# Patient Record
Sex: Female | Born: 1970 | Race: White | Hispanic: No | Marital: Married | Smoking: Never smoker
Health system: Southern US, Community
[De-identification: ages and names within clinical notes are randomized; demographics above are authoritative.]

## PROBLEM LIST (undated history)

## (undated) DIAGNOSIS — R12 Heartburn: Secondary | ICD-10-CM

## (undated) DIAGNOSIS — Z309 Encounter for contraceptive management, unspecified: Secondary | ICD-10-CM

## (undated) DIAGNOSIS — R58 Hemorrhage, not elsewhere classified: Secondary | ICD-10-CM

## (undated) HISTORY — DX: Heartburn: R12

## (undated) HISTORY — PX: CHOLECYSTECTOMY: SHX55

## (undated) HISTORY — DX: Hemorrhage, not elsewhere classified: R58

## (undated) HISTORY — DX: Encounter for contraceptive management, unspecified: Z30.9

---

## 2001-10-31 ENCOUNTER — Inpatient Hospital Stay (HOSPITAL_COMMUNITY): Admission: RE | Admit: 2001-10-31 | Discharge: 2001-11-03 | Payer: Self-pay | Admitting: Obstetrics and Gynecology

## 2004-02-06 ENCOUNTER — Ambulatory Visit (HOSPITAL_COMMUNITY): Admission: AD | Admit: 2004-02-06 | Discharge: 2004-02-06 | Payer: Self-pay | Admitting: Internal Medicine

## 2004-02-16 ENCOUNTER — Ambulatory Visit (HOSPITAL_COMMUNITY): Admission: RE | Admit: 2004-02-16 | Discharge: 2004-02-16 | Payer: Self-pay | Admitting: Obstetrics & Gynecology

## 2004-04-23 ENCOUNTER — Ambulatory Visit (HOSPITAL_COMMUNITY): Admission: AD | Admit: 2004-04-23 | Discharge: 2004-04-23 | Payer: Self-pay | Admitting: Obstetrics & Gynecology

## 2004-04-25 ENCOUNTER — Inpatient Hospital Stay (HOSPITAL_COMMUNITY): Admission: AD | Admit: 2004-04-25 | Discharge: 2004-04-28 | Payer: Self-pay | Admitting: Obstetrics and Gynecology

## 2004-05-28 ENCOUNTER — Ambulatory Visit (HOSPITAL_COMMUNITY): Admission: RE | Admit: 2004-05-28 | Discharge: 2004-05-28 | Payer: Self-pay | Admitting: General Surgery

## 2008-01-10 ENCOUNTER — Other Ambulatory Visit: Admission: RE | Admit: 2008-01-10 | Discharge: 2008-01-10 | Payer: Self-pay | Admitting: Obstetrics and Gynecology

## 2009-03-12 ENCOUNTER — Other Ambulatory Visit: Admission: RE | Admit: 2009-03-12 | Discharge: 2009-03-12 | Payer: Self-pay | Admitting: Obstetrics and Gynecology

## 2010-05-09 ENCOUNTER — Other Ambulatory Visit
Admission: RE | Admit: 2010-05-09 | Discharge: 2010-05-09 | Payer: Self-pay | Source: Home / Self Care | Admitting: Obstetrics and Gynecology

## 2011-02-07 NOTE — Op Note (Signed)
NAME:  Betty Coleman, Betty Coleman                         ACCOUNT NO.:  [id number]   MEDICAL RECORD NO.:  [id number]                   PATIENT TYPE:  AMB   LOCATION:  DAY                                  FACILITY:  [facility]   PHYSICIAN:  Dalia Heading, M.D.               DATE OF BIRTH:  03/19/1971   DATE OF PROCEDURE:  05/28/2004  DATE OF DISCHARGE:                                 OPERATIVE REPORT   PREOPERATIVE DIAGNOSIS:  Cholecystitis, cholelithiasis.   POSTOPERATIVE DIAGNOSIS:  Cholecystitis, cholelithiasis.   OPERATION PERFORMED:  Laparoscopic cholecystectomy.   SURGEON:  Dalia Heading, M.D.   ANESTHESIA:  General endotracheal.   INDICATIONS FOR PROCEDURE:  The patient is a 40 year old white female who  presents with cholecystitis secondary to cholelithiasis.  This was diagnosed  while she was pregnant and she is now postpartum.  The risks and benefits of  the procedure including bleeding, infection, hepatobiliary injury, and the  possibility of an open procedure were fully explained to the patient, who  gave informed consent.   DESCRIPTION OF PROCEDURE:  The patient was placed in supine position.  After  induction of general endotracheal anesthesia, the abdomen was prepped and  draped using the usual sterile technique with Betadine.  Surgical site  confirmation was performed.  An infraumbilical incision was made down to the  fascia.  A Veress needle was introduced to the abdominal cavity and  confirmation of placement was done using the saline drop test.  The abdomen  was then insufflated to 16 mmHg pressure.  An 11 mm trocar was introduced  into the abdominal cavity under direct visualization without difficulty.  The patient was placed in reverse Trendelenburg position and an additional  11 mm trocar was placed in the epigastric region and a 5 mm trocar was  placed in the right upper quadrant, right flank regions.  The liver was  inspected and noted to be within normal limits.   There were multiple  adhesions of bowel around the gallbladder.  These were freed away without  difficulty.  The gallbladder was retracted superior and laterally.  The  dissection was begun around the infundibulum of the gallbladder.  The cystic  duct was first identified.  It was noted to be sclerotic.  The juncture with  the infundibulum was fully identified.  Endoclips were placed proximally and  distally on the cystic duct and the cystic duct was divided. This was  likewise done on the cystic artery.  The gallbladder was then freed away  from the gallbladder fossa using Bovie electrocautery.  The gallbladder was  delivered through the epigastric trocar site using EndoCatch bag.  The  gallbladder fossa was inspected and no abnormal bleeding or bile leakage was  noted.  Surgicel was placed in the gallbladder fossa.  All fluid and air  were then evacuated from the abdominal cavity prior to removal of the  trocars.  All wounds were irrigated with normal saline.  All wounds were injected with  0.5% Sensorcaine.  The infraumbilical fascia was reapproximated using a 0  Vicryl interrupted suture.  All skin incisions were closed using staples.  Betadine ointment and dry sterile dressings were applied.  All tape and  needle counts correct at the end of the procedure.  The patient was  extubated in the operating room and went back to recovery room awake and in  stable condition.   COMPLICATIONS:  None.   SPECIMENS:  Gallbladder.   ESTIMATED BLOOD LOSS:  Minimal.      ___________________________________________                                            Dalia Heading, M.D.   MAJ/MEDQ  D:  05/28/2004  T:  05/28/2004  Job:  [id number]   cc:   Lazaro Arms, M.D.  [address]  [place]  [place] [address]  Fax: [phone]

## 2011-02-07 NOTE — H&P (Signed)
NAME:  CASIA, CORTI                         ACCOUNT NO.:  [id number]   MEDICAL RECORD NO.:  [id number]                   PATIENT TYPE:  PINP   LOCATION:                                       FACILITY:  [facility]   PHYSICIAN:  Tilda Burrow, M.D.              DATE OF BIRTH:  10-Jan-1971   DATE OF ADMISSION:  DATE OF DISCHARGE:                                HISTORY & PHYSICAL   ADMITTING DIAGNOSIS:  Pregnancy 40-1/2 to 41-1/2 weeks' gestation, reactive  NST, impending post dates.   HISTORY OF PRESENT ILLNESS:  This 40 year old female, gravida 2, para 1, AB  0, last menstrual period (?) July 10, 2003, plus [facility] July 25, with 18-  week ultrasound suggesting July 31, and ultrasound in first trimester  suggesting August 4.  She was admitted on the evening of August 4 for  cervical ripening and Pitocin induction on the 5th.  She has been followed  through our office through 16 prenatal visits.  Appropriate weight gain and  fundal height.  The pregnancy has been  complicated by gallstones which are  currently silent.  She has been seen by Dr. Lovell Sheehan in the past who will  likely follow up with her postpartum.   PAST MEDICAL HISTORY:  Benign surgical history, negative.   ALLERGIES:  None.   SOCIAL HISTORY:  Married, florist.   PRIOR OB HISTORY:  Notable for delivery in 2003 induced with Cervidil with  labor less than 12 hours resulting in a health delivery of a 6 pound, 7  ounce female, by Zerita Boers.   PHYSICAL EXAMINATION:  VITAL SIGNS:  Height 5 feet, 4 inches.  Weight 150.  Blood pressure 110/80.  GENERAL:  Shows healthy female, appropriate for gestational age.  Fundal  height 38 cm.  Cervical still 1 to 2 cm thick, soft, posterior and high with  ballotable vertex as of April 23, 2004.   PRENATAL LABORATORY DATA:  Blood type A positive, UDS negative, rubella  immunity present.  Hemoglobin 13, hematocrit 42.  Hepatitis/HIV, GC,  Chlamydia, RPR are all negative.  Glucose  tolerance test abnormal at 163 mg  percent at one hour with normal three hour GTT.   She request epidural, and plans to have the baby circumcised if female.  Plans  for pill contraception.  Will take the baby to [facility].   IMPRESSION:  1. Pregnancy 40-1/2 to 41-1/2 weeks, impending post dates.  2. Induction of labor.   PLAN:  Cervical ripening April 25, 2004.  Pitocin induction April 26, 2004.     ___________________________________________                                         Tilda Burrow, M.D.   JVF/MEDQ  D:  04/24/2004  T:  04/25/2004  Job:  [id number]   cc:   Francoise Schaumann. Halm, D.O.  [address], [address]  [place]  [place] [address]  Fax: [phone]

## 2011-02-07 NOTE — Op Note (Signed)
[facility]  Patient:    LAFERN, BRINKLEY Visit Number: [id number] MRN: [id number]          Service Type: OBS Location: 4A A418 01 Attending Physician:  Tilda Burrow Dictated by:   Zerita Boers, CNM Proc. Date: 11/01/01 Admit Date:  10/31/2001   CC:         [facility]                           Operative Report  ONSET OF LABOR:  November 01, 2001, at 6 a.m.  DATE OF DELIVERY:  November 01, 2001, at 8:55 a.m.  LENGTH OF FIRST STAGE OF LABOR:  2 hours 25 minutes.  LENGTH OF SECOND STAGE OF LABOR:  30 minutes.  LENGTH OF THIRD STAGE OF LABOR:  15 minutes.  DELIVERY NOTE:  Purva had a vacuum-assisted delivery from a +2-+3 station due to fetal bradycardia.  During the course of pushing, fetal heart was assessed and accelerations were noted with scalp stem.  The Kiwi was applied and the patient was assisted through approximately four contractions and midline episiotomy was cut under local anesthesia of 1% lidocaine with spontaneous delivery of head and spontaneous delivery of infant without any nuchal cord noted.  Infant had spontaneous cry, good tone, good color upon delivery. Cord was clamped.  Father cut the cord and infant was laid upon the abdomen of the mother, crying vigorously, to be dried by the nurse.  Infant Apgars were 9 and 9.  The infant weight was 6 pounds, 7 ounces.  Mother and infant both tolerated the procedure well.  Episiotomy was repaired with a blanket stitch of approximately three sutures subcuticular to good close with good hemostasis obtained.  Placenta was delivered spontaneously via _____ mechanism.  Third stage of labor was actually managed with 20 units Pitocin and 1000 cc of D-5 lactated Ringers at a rapid rate.   Estimated blood loss was approximately 350 cc.  After episiotomy repair, the infant was placed to breast, nursing well.  Infant and mother both stabilized and transferred out to the postpartum unit  in stable condition. Dictated by:   Zerita Boers, CNM Attending Physician:  Tilda Burrow DD:  11/01/01 TD:  11/01/01 Job: [id number]

## 2011-02-07 NOTE — H&P (Signed)
NAME:  Betty Coleman, Betty Coleman                         ACCOUNT NO.:  [id number]   MEDICAL RECORD NO.:  [id number]                   PATIENT TYPE:  AMB   LOCATION:  DAY                                  FACILITY:  [facility]   PHYSICIAN:  Dalia Heading, M.D.               DATE OF BIRTH:  04-Nov-1970   DATE OF ADMISSION:  DATE OF DISCHARGE:                                HISTORY & PHYSICAL   CHIEF COMPLAINT:  Cholecystitis, cholelithiasis.   HISTORY OF PRESENT ILLNESS:  The patient is a 40 year old white female who  is referred for evaluation and treatment of biliary colic secondary to  cholelithiasis.  Her cholelithiasis was diagnosed when she was [redacted] weeks  pregnant.  She did subsequently have a vaginal delivery, 1 month ago.  She  now presents for a cholecystectomy due to ongoing biliary colic.   PAST MEDICAL HISTORY:  As noted above.   PAST SURGICAL HISTORY:  Unremarkable.   CURRENT MEDICATIONS:  Prevacid, perinatal vitamins.   ALLERGIES:  No known drug allergies.   REVIEW OF SYSTEMS:  Noncontributory.   PHYSICAL EXAMINATION:  GENERAL:  On physical examination, the patient is a  well-developed well-nourished white female in no acute distress.  VITAL SIGNS:  She is afebrile and vital signs are stable.  LUNGS:  Clear to auscultation with equal breath sounds bilaterally.  HEART:  Heart examination reveals a regular rate and rhythm without S3, S4,  or murmurs.  ABDOMEN:  The abdomen is soft, nontender, nondistended.  No  hepatosplenomegaly or masses are noted.   IMPRESSION:  Biliary colic, cholelithiasis.   PLAN:  The patient is scheduled for laparoscopic cholecystectomy on  05/28/2004.  The risks and benefits of the procedure including bleeding,  infection, hepatobiliary injury, and the possibility of an open procedure  were fully explained to the patient, who gave informed consent.  She was  instructed not to breast feed for 48 hours after the surgery.     ___________________________________________                                         Dalia Heading, M.D.   MAJ/MEDQ  D:  05/23/2004  T:  05/23/2004  Job:  [id number]   cc:   Lazaro Arms, M.D.  [address]  [place]  [place] [address]  Fax: [phone]   Dalia Heading, M.D.  [address], [place]  [place] [address]  Fax: [phone]

## 2011-02-07 NOTE — Op Note (Signed)
NAME:  MOET, MIKULSKI                         ACCOUNT NO.:  [id number]   MEDICAL RECORD NO.:  [id number]                   PATIENT TYPE:  INP   LOCATION:  NA                                   FACILITY:  [facility]   PHYSICIAN:  Tilda Burrow, M.D.              DATE OF BIRTH:  [birth year]   DATE OF PROCEDURE:  DATE OF DISCHARGE:                                 OPERATIVE REPORT   Rakesha progressed steadily through labor, denied the need for IV pain  medicine and developed a strong urge to push at approximately 11:40.  There  was a small anterior lip which was reduced over two pushes and she delivered  a viable female infant at 11:49.  The mouth and nose were suctioned in the  perineum, the body delivered without difficulty.  Weight 7 pounds, 7 ounces,  Apgars 9 and 9.  Pitocin 20 units diluted in 1000 mL of lactated Ringers was  then infused rapidly IV.  The placenta separated spontaneously and was  delivered via controlled cord traction at 11:55.  It was inspected and  appears to be intact with a 3-vessel cord.  The fundus was firm with minimal  blood loss noted.  The vagina was then inspected and no lacerations were  found.  Estimated blood loss 300 mL.     ________________________________________  ___________________________________________  Jacklyn Shell, C.N.M.           Tilda Burrow, M.D.   FC/MEDQ  D:  04/26/2004  T:  04/28/2004  Job:  [id number]   cc:   [facility]  [address]  [place], [place] [address]

## 2011-07-29 ENCOUNTER — Other Ambulatory Visit (HOSPITAL_COMMUNITY)
Admission: RE | Admit: 2011-07-29 | Discharge: 2011-07-29 | Disposition: A | Discharge status: Home / Self Care | Payer: PRIVATE HEALTH INSURANCE | Source: Ambulatory Visit | Attending: Obstetrics and Gynecology | Admitting: Obstetrics and Gynecology

## 2011-07-29 DIAGNOSIS — Z01419 Encounter for gynecological examination (general) (routine) without abnormal findings: Secondary | ICD-10-CM | POA: Insufficient documentation

## 2012-09-10 ENCOUNTER — Other Ambulatory Visit (HOSPITAL_COMMUNITY)
Admission: RE | Admit: 2012-09-10 | Discharge: 2012-09-10 | Disposition: A | Discharge status: Home / Self Care | Payer: PRIVATE HEALTH INSURANCE | Source: Ambulatory Visit | Attending: Obstetrics and Gynecology | Admitting: Obstetrics and Gynecology

## 2012-09-10 DIAGNOSIS — Z1151 Encounter for screening for human papillomavirus (HPV): Secondary | ICD-10-CM | POA: Insufficient documentation

## 2012-09-10 DIAGNOSIS — Z01419 Encounter for gynecological examination (general) (routine) without abnormal findings: Secondary | ICD-10-CM | POA: Insufficient documentation

## 2012-12-20 ENCOUNTER — Other Ambulatory Visit: Payer: Self-pay | Admitting: Obstetrics and Gynecology

## 2012-12-20 ENCOUNTER — Other Ambulatory Visit: Payer: Self-pay | Admitting: Adult Health

## 2012-12-20 DIAGNOSIS — Z139 Encounter for screening, unspecified: Secondary | ICD-10-CM

## 2012-12-23 ENCOUNTER — Ambulatory Visit (HOSPITAL_COMMUNITY): Payer: PRIVATE HEALTH INSURANCE

## 2012-12-27 ENCOUNTER — Ambulatory Visit (HOSPITAL_COMMUNITY)
Admission: RE | Admit: 2012-12-27 | Discharge: 2012-12-27 | Disposition: A | Discharge status: Home / Self Care | Payer: PRIVATE HEALTH INSURANCE | Source: Ambulatory Visit | Attending: Adult Health | Admitting: Adult Health

## 2012-12-27 DIAGNOSIS — Z1231 Encounter for screening mammogram for malignant neoplasm of breast: Secondary | ICD-10-CM | POA: Insufficient documentation

## 2012-12-27 DIAGNOSIS — Z139 Encounter for screening, unspecified: Secondary | ICD-10-CM

## 2012-12-28 ENCOUNTER — Telehealth: Payer: Self-pay | Admitting: Adult Health

## 2012-12-28 NOTE — Telephone Encounter (Signed)
Left message to call me.

## 2012-12-29 ENCOUNTER — Other Ambulatory Visit: Payer: Self-pay | Admitting: Adult Health

## 2012-12-29 ENCOUNTER — Telehealth: Payer: Self-pay | Admitting: Adult Health

## 2012-12-29 DIAGNOSIS — R928 Other abnormal and inconclusive findings on diagnostic imaging of breast: Secondary | ICD-10-CM

## 2012-12-29 NOTE — Telephone Encounter (Signed)
Pt. Called me back and I informed her of need for further imaging of breast and gave her the number to call xray to call as she will be out of town for several days.

## 2013-01-05 ENCOUNTER — Encounter (HOSPITAL_COMMUNITY): Payer: PRIVATE HEALTH INSURANCE

## 2013-01-05 ENCOUNTER — Ambulatory Visit (HOSPITAL_COMMUNITY)
Admission: RE | Admit: 2013-01-05 | Discharge: 2013-01-05 | Disposition: A | Discharge status: Home / Self Care | Payer: PRIVATE HEALTH INSURANCE | Source: Ambulatory Visit | Attending: Adult Health | Admitting: Adult Health

## 2013-01-05 DIAGNOSIS — R928 Other abnormal and inconclusive findings on diagnostic imaging of breast: Secondary | ICD-10-CM | POA: Insufficient documentation

## 2013-01-06 ENCOUNTER — Telehealth: Payer: Self-pay | Admitting: Adult Health

## 2013-01-06 NOTE — Telephone Encounter (Signed)
Left message that follow up mammogram was normal

## 2013-08-21 ENCOUNTER — Other Ambulatory Visit: Payer: Self-pay | Admitting: Adult Health

## 2013-08-31 ENCOUNTER — Ambulatory Visit (INDEPENDENT_AMBULATORY_CARE_PROVIDER_SITE_OTHER): Payer: PRIVATE HEALTH INSURANCE | Admitting: Family Medicine

## 2013-08-31 ENCOUNTER — Encounter: Payer: Self-pay | Admitting: Family Medicine

## 2013-08-31 VITALS — BP 122/68 | Temp 100.4°F | Ht 63.0 in | Wt 135.2 lb

## 2013-08-31 DIAGNOSIS — J111 Influenza due to unidentified influenza virus with other respiratory manifestations: Secondary | ICD-10-CM

## 2013-08-31 MED ORDER — OSELTAMIVIR PHOSPHATE 75 MG PO CAPS: 75.0000 mg | ORAL_CAPSULE | Freq: Two times a day (BID) | ORAL | Status: DC

## 2013-08-31 NOTE — Progress Notes (Signed)
   Subjective:    Patient ID: Betty Coleman, female    DOB: [birth year], 42 y.o.   MRN: [id number]  Fever  This is a new problem. The current episode started in the past 7 days. The problem occurs constantly. The problem has been unchanged. The maximum temperature noted was 100 to 100.9 F. The temperature was taken using an oral thermometer. Associated symptoms include congestion, coughing, headaches and muscle aches. She has tried NSAIDs for the symptoms. The treatment provided mild relief.   Hit hard on Monday,  26 y o has had cough for several wks,  Sund, tickle some exp to public  m102 t max, don't smoke headche off and on Took one ibu     Review of Systems  Constitutional: Positive for fever.  HENT: Positive for congestion.   Respiratory: Positive for cough.   Neurological: Positive for headaches.       Objective:   Physical Exam Alert moderate malaise. Temp 100.4. HEENT moderate nasal congestion pharynx normal neck supple. Lungs clear. Heart regular in rhythm.       Assessment & Plan:  Impression 1 flu discussed plan Tamiflu twice a day 5 days. Symptomatic care discussed. WSL

## 2013-10-10 ENCOUNTER — Other Ambulatory Visit: Payer: PRIVATE HEALTH INSURANCE | Admitting: Adult Health

## 2013-11-08 ENCOUNTER — Other Ambulatory Visit: Payer: PRIVATE HEALTH INSURANCE | Admitting: Adult Health

## 2013-11-24 ENCOUNTER — Other Ambulatory Visit: Payer: PRIVATE HEALTH INSURANCE | Admitting: Adult Health

## 2013-11-29 ENCOUNTER — Encounter: Payer: Self-pay | Admitting: Adult Health

## 2013-11-29 ENCOUNTER — Encounter (INDEPENDENT_AMBULATORY_CARE_PROVIDER_SITE_OTHER): Payer: Self-pay

## 2013-11-29 ENCOUNTER — Ambulatory Visit (INDEPENDENT_AMBULATORY_CARE_PROVIDER_SITE_OTHER): Payer: PRIVATE HEALTH INSURANCE | Admitting: Adult Health

## 2013-11-29 VITALS — BP 130/78 | HR 72 | Ht 63.0 in | Wt 136.0 lb

## 2013-11-29 DIAGNOSIS — K648 Other hemorrhoids: Secondary | ICD-10-CM | POA: Insufficient documentation

## 2013-11-29 DIAGNOSIS — Z309 Encounter for contraceptive management, unspecified: Secondary | ICD-10-CM | POA: Insufficient documentation

## 2013-11-29 DIAGNOSIS — Z01419 Encounter for gynecological examination (general) (routine) without abnormal findings: Secondary | ICD-10-CM

## 2013-11-29 DIAGNOSIS — R58 Hemorrhage, not elsewhere classified: Secondary | ICD-10-CM

## 2013-11-29 DIAGNOSIS — Z1212 Encounter for screening for malignant neoplasm of rectum: Secondary | ICD-10-CM

## 2013-11-29 HISTORY — DX: Encounter for contraceptive management, unspecified: Z30.9

## 2013-11-29 HISTORY — DX: Hemorrhage, not elsewhere classified: R58

## 2013-11-29 LAB — HEMOCCULT GUIAC POC 1CARD (OFFICE): Fecal Occult Blood, POC: POSITIVE

## 2013-11-29 LAB — COMPREHENSIVE METABOLIC PANEL
ALBUMIN: 3.9 g/dL (ref 3.5–5.2)
ALK PHOS: 41 U/L (ref 39–117)
ALT: 13 U/L (ref 0–35)
AST: 17 U/L (ref 0–37)
BUN: 12 mg/dL (ref 6–23)
CALCIUM: 9.1 mg/dL (ref 8.4–10.5)
CHLORIDE: 102 meq/L (ref 96–112)
CO2: 27 meq/L (ref 19–32)
CREATININE: 0.79 mg/dL (ref 0.50–1.10)
GLUCOSE: 85 mg/dL (ref 70–99)
Potassium: 4.3 mEq/L (ref 3.5–5.3)
Sodium: 137 mEq/L (ref 135–145)
TOTAL PROTEIN: 7 g/dL (ref 6.0–8.3)
Total Bilirubin: 0.4 mg/dL (ref 0.2–1.2)

## 2013-11-29 LAB — CBC
HEMATOCRIT: 40.1 % (ref 36.0–46.0)
Hemoglobin: 13.5 g/dL (ref 12.0–15.0)
MCH: 30.1 pg (ref 26.0–34.0)
MCHC: 33.7 g/dL (ref 30.0–36.0)
MCV: 89.3 fL (ref 78.0–100.0)
Platelets: 396 10*3/uL (ref 150–400)
RBC: 4.49 MIL/uL (ref 3.87–5.11)
RDW: 13.4 % (ref 11.5–15.5)
WBC: 7.2 10*3/uL (ref 4.0–10.5)

## 2013-11-29 LAB — LIPID PANEL
Cholesterol: 218 mg/dL — ABNORMAL HIGH (ref 0–200)
HDL: 66 mg/dL (ref >39)
LDL CALC: 127 mg/dL — AB (ref 0–99)
TRIGLYCERIDES: 124 mg/dL (ref <150)
Total CHOL/HDL Ratio: 3.3 Ratio
VLDL: 25 mg/dL (ref 0–40)

## 2013-11-29 LAB — TSH: TSH: 1.32 u[IU]/mL (ref 0.350–4.500)

## 2013-11-29 NOTE — Progress Notes (Signed)
Patient ID: Betty Coleman, female   DOB: 1971-09-21, 43 y.o.   MRN: [id number] History of Present Illness: Betty Coleman is a 43 year old white female married in for a physical, she had a normal pap with negative HPV 08/2012.   Current Medications, Allergies, Past Medical History, Past Surgical History, Family History and Social History were reviewed in [employer] record.     Review of Systems: Patient denies any headaches, blurred vision, shortness of breath, chest pain, abdominal pain, problems with bowel movements, urination, or intercourse. Has pain right thumb area, no mood swings, has had some BTB if late taking OC.    Physical Exam:BP 130/78  Pulse 72  Ht 5' 3"  (1.6 m)  Wt 136 lb (61.689 kg)  BMI 24.10 kg/m2  LMP 11/14/2013 General:  Well developed, well nourished, no acute distress Skin:  Warm and dry Neck:  Midline trachea, normal thyroid Lungs; Clear to auscultation bilaterally Breast:  No dominant palpable mass, retraction, or nipple discharge Cardiovascular: Regular rate and rhythm Abdomen:  Soft, non tender, no hepatosplenomegaly Pelvic:  External genitalia is normal in appearance.  The vagina is normal in appearance.  The cervix is bulbous.  Uterus is felt to be normal size, shape, and contour.  No  adnexal masses or tenderness noted. Rectal: Good sphincter tone, no polyps,internal hemorrhoids felt.  Hemoccult positive, an she has noticed itching and irritation at times Extremities:  No swelling or varicosities noted, no tenderness in wrist or thumb today, has had motrin Psych:  No mood changes, alert and cooperative, seems happy   Impression: Yearly gyn exam no pap Contraceptive management Internal hemorrhoids with + hemoccult     Plan: Physical in 1 year Mammogram yearly Check CBC,CMP,TSH and lipids Sent 3 hemoccult cards home, try anusol supp, if still + refer to GI Get splint for wrist to see if helps, discussed Harriet Pho Review  handout on Harriet Pho and hemorrhoids

## 2013-11-29 NOTE — Patient Instructions (Signed)
Hemorrhoids Hemorrhoids are swollen veins around the rectum or anus. There are two types of hemorrhoids:   Internal hemorrhoids. These occur in the veins just inside the rectum. They may poke through to the outside and become irritated and painful.  External hemorrhoids. These occur in the veins outside the anus and can be felt as a painful swelling or hard lump near the anus. CAUSES  Pregnancy.   Obesity.   Constipation or diarrhea.   Straining to have a bowel movement.   Sitting for long periods on the toilet.  Heavy lifting or other activity that caused you to strain.  Anal intercourse. SYMPTOMS   Pain.   Anal itching or irritation.   Rectal bleeding.   Fecal leakage.   Anal swelling.   One or more lumps around the anus.  DIAGNOSIS  Your caregiver may be able to diagnose hemorrhoids by visual examination. Other examinations or tests that may be performed include:   Examination of the rectal area with a gloved hand (digital rectal exam).   Examination of anal canal using a small tube (scope).   A blood test if you have lost a significant amount of blood.  A test to look inside the colon (sigmoidoscopy or colonoscopy). TREATMENT Most hemorrhoids can be treated at home. However, if symptoms do not seem to be getting better or if you have a lot of rectal bleeding, your caregiver may perform a procedure to help make the hemorrhoids get smaller or remove them completely. Possible treatments include:   Placing a rubber band at the base of the hemorrhoid to cut off the circulation (rubber band ligation).   Injecting a chemical to shrink the hemorrhoid (sclerotherapy).   Using a tool to burn the hemorrhoid (infrared light therapy).   Surgically removing the hemorrhoid (hemorrhoidectomy).   Stapling the hemorrhoid to block blood flow to the tissue (hemorrhoid stapling).  HOME CARE INSTRUCTIONS   Eat foods with fiber, such as whole grains, beans,  nuts, fruits, and vegetables. Ask your doctor about taking products with added fiber in them (fibersupplements).  Increase fluid intake. Drink enough water and fluids to keep your urine clear or pale yellow.   Exercise regularly.   Go to the bathroom when you have the urge to have a bowel movement. Do not wait.   Avoid straining to have bowel movements.   Keep the anal area dry and clean. Use wet toilet paper or moist towelettes after a bowel movement.   Medicated creams and suppositories may be used or applied as directed.   Only take over-the-counter or prescription medicines as directed by your caregiver.   Take warm sitz baths for 15 20 minutes, 3 4 times a day to ease pain and discomfort.   Place ice packs on the hemorrhoids if they are tender and swollen. Using ice packs between sitz baths may be helpful.   Put ice in a plastic bag.   Place a towel between your skin and the bag.   Leave the ice on for 15 20 minutes, 3 4 times a day.   Do not use a donut-shaped pillow or sit on the toilet for long periods. This increases blood pooling and pain.  SEEK MEDICAL CARE IF:  You have increasing pain and swelling that is not controlled by treatment or medicine.  You have uncontrolled bleeding.  You have difficulty or you are unable to have a bowel movement.  You have pain or inflammation outside the area of the hemorrhoids. MAKE SURE YOU:  Understand these instructions.  Will watch your condition.  Will get help right away if you are not doing well or get worse. Document Released: 09/05/2000 Document Revised: 08/25/2012 Document Reviewed: 07/13/2012 [facility] Patient Information 2014 [place]. De Quervain's Disease Harriet Pho disease is a condition often seen in racquet sports where there is a soreness (inflammation) in the cord like structures (tendons) which attach muscle to bone on the thumb side of the wrist. There may be a tightening of the  tissuesaround the tendons. This condition is often helped by giving up or modifying the activity which caused it. When conservative treatment does not help, surgery may be required. Conservative treatment could include changes in the activity which brought about the problem or made it worse. Anti-inflammatory medications and injections may be used to help decrease the inflammation and help with pain control. Your caregiver will help you determine which is best for you. DIAGNOSIS  Often the diagnosis (learning what is wrong) can be made by examination. Sometimes x-rays are required. HOME CARE INSTRUCTIONS   Apply ice to the sore area for 15-20 minutes, 03-04 times per day while awake. Put the ice in a plastic bag and place a towel between the bag of ice and your skin. This is especially helpful if it can be done after all activities involving the sore wrist.  Temporary splinting may help.  Only take over-the-counter or prescription medicines for pain, discomfort or fever as directed by your caregiver. SEEK MEDICAL CARE IF:   Pain relief is not obtained with medications, or if you have increasing pain and seem to be getting worse rather than better. MAKE SURE YOU:   Understand these instructions.  Will watch your condition.  Will get help right away if you are not doing well or get worse. Document Released: 06/03/2001 Document Revised: 12/01/2011 Document Reviewed: 09/08/2005 [facility] Patient Information 2014 [place]. Physical in 1 year Mammogram yearly Do 3 hemoccult cards  call for labs

## 2013-12-02 ENCOUNTER — Telehealth: Payer: Self-pay | Admitting: Adult Health

## 2013-12-02 NOTE — Telephone Encounter (Signed)
Pt requesting results for hemoccult card, informed card done in office was positive that is way Derrek Monaco, NP sent her home with 3 cards. Pt verbalized understanding and stated would bring the 3 hemoccult cards back Monday.

## 2013-12-02 NOTE — Telephone Encounter (Signed)
Left message labs good will mail copy

## 2013-12-05 ENCOUNTER — Other Ambulatory Visit (INDEPENDENT_AMBULATORY_CARE_PROVIDER_SITE_OTHER): Payer: PRIVATE HEALTH INSURANCE

## 2013-12-05 ENCOUNTER — Other Ambulatory Visit: Payer: Self-pay | Admitting: Adult Health

## 2013-12-05 DIAGNOSIS — Z1231 Encounter for screening mammogram for malignant neoplasm of breast: Secondary | ICD-10-CM

## 2013-12-05 DIAGNOSIS — K921 Melena: Secondary | ICD-10-CM

## 2013-12-05 LAB — HEMOCCULT GUIAC POC 1CARD (OFFICE)
Card #3 Fecal Occult Blood, POC: NEGATIVE
FECAL OCCULT BLD: NEGATIVE
Fecal Occult Blood, POC: NEGATIVE

## 2013-12-08 ENCOUNTER — Telehealth: Payer: Self-pay | Admitting: Adult Health

## 2013-12-08 NOTE — Telephone Encounter (Signed)
Pt informed of negative hemoccult card results.

## 2014-01-09 ENCOUNTER — Ambulatory Visit (HOSPITAL_COMMUNITY)
Admission: RE | Admit: 2014-01-09 | Discharge: 2014-01-09 | Disposition: A | Discharge status: Home / Self Care | Payer: 59 | Source: Ambulatory Visit | Attending: Adult Health | Admitting: Adult Health

## 2014-01-09 DIAGNOSIS — Z1231 Encounter for screening mammogram for malignant neoplasm of breast: Secondary | ICD-10-CM | POA: Insufficient documentation

## 2014-03-03 ENCOUNTER — Other Ambulatory Visit: Payer: Self-pay | Admitting: Adult Health

## 2014-07-24 ENCOUNTER — Encounter: Payer: Self-pay | Admitting: Adult Health

## 2014-12-05 ENCOUNTER — Other Ambulatory Visit: Payer: PRIVATE HEALTH INSURANCE | Admitting: Adult Health

## 2014-12-28 ENCOUNTER — Encounter: Payer: Self-pay | Admitting: Adult Health

## 2014-12-28 ENCOUNTER — Other Ambulatory Visit (HOSPITAL_COMMUNITY)
Admission: RE | Admit: 2014-12-28 | Discharge: 2014-12-28 | Disposition: A | Discharge status: Home / Self Care | Payer: 59 | Source: Ambulatory Visit | Attending: Adult Health | Admitting: Adult Health

## 2014-12-28 ENCOUNTER — Ambulatory Visit (INDEPENDENT_AMBULATORY_CARE_PROVIDER_SITE_OTHER): Payer: PRIVATE HEALTH INSURANCE | Admitting: Adult Health

## 2014-12-28 VITALS — BP 110/70 | HR 76 | Ht 63.0 in | Wt 138.0 lb

## 2014-12-28 DIAGNOSIS — Z1212 Encounter for screening for malignant neoplasm of rectum: Secondary | ICD-10-CM

## 2014-12-28 DIAGNOSIS — Z01419 Encounter for gynecological examination (general) (routine) without abnormal findings: Secondary | ICD-10-CM | POA: Insufficient documentation

## 2014-12-28 DIAGNOSIS — Z1151 Encounter for screening for human papillomavirus (HPV): Secondary | ICD-10-CM | POA: Insufficient documentation

## 2014-12-28 DIAGNOSIS — Z3041 Encounter for surveillance of contraceptive pills: Secondary | ICD-10-CM

## 2014-12-28 LAB — HEMOCCULT GUIAC POC 1CARD (OFFICE): FECAL OCCULT BLD: NEGATIVE

## 2014-12-28 MED ORDER — NORGESTIM-ETH ESTRAD TRIPHASIC 0.18/0.215/0.25 MG-35 MCG PO TABS: 1.0000 | ORAL_TABLET | Freq: Every day | ORAL | Status: DC

## 2014-12-28 NOTE — Progress Notes (Signed)
Patient ID: Betty Coleman, female   DOB: 10/24/1970, 44 y.o.   MRN: [id number] History of Present Illness: Betty Coleman is a 44 year old white female, married in for well woman gyn exam and pap.She is on tri sprintec and happy with her pills but wants to discuss other options.   Current Medications, Allergies, Past Medical History, Past Surgical History, Family History and Social History were reviewed in [employer] record.     Review of Systems: Patient denies any daily headaches, hearing loss, fatigue, blurred vision, shortness of breath, chest pain, abdominal pain, problems with bowel movements, urination, or intercourse. No joint pain or mood swings.May have headache before her period and has had a couple of yeast infections this year.    Physical Exam:BP 110/70 mmHg  Pulse 76  Ht 5' 3"  (1.6 m)  Wt 138 lb (62.596 kg)  BMI 24.45 kg/m2  LMP 12/07/2014 General:  Well developed, well nourished, no acute distress Skin:  Warm and dry Neck:  Midline trachea, normal thyroid, good ROM, no lymphadenopathy Lungs; Clear to auscultation bilaterally Breast:  No dominant palpable mass, retraction, or nipple discharge Cardiovascular: Regular rate and rhythm Abdomen:  Soft, non tender, no hepatosplenomegaly Pelvic:  External genitalia is normal in appearance, no lesions.  The vagina is normal in appearance, has good color, moisture and rugae.Marland Kitchen Urethra has no lesions or masses. The cervix is bulbous. Pap with HPV performed. Uterus is felt to be normal size, shape, and contour.  No adnexal masses or tenderness noted.Bladder is non tender, no masses felt. Rectal: Good sphincter tone, no polyps, or hemorrhoids felt.  Hemoccult negative. Extremities/musculoskeletal:  No swelling or varicosities noted, no clubbing or cyanosis Psych:  No mood changes, alert and cooperative,seems happy Discussed OCs,IUD,nexplanon and depo and ring and she will continue with OCs.  Impression: Well woman  gyn exam with pap Contraceptive management    Plan: Refilled tri sprintec x 1 year Mammogram yearly Physical in 1 year,pap in 3 if normal with negative HPV Labs next year,fasting

## 2014-12-28 NOTE — Patient Instructions (Signed)
Physical in 1 year Labs next year Mammogram yearly

## 2014-12-29 LAB — CYTOLOGY - PAP

## 2015-03-15 ENCOUNTER — Telehealth: Payer: Self-pay | Admitting: Adult Health

## 2015-03-15 NOTE — Telephone Encounter (Signed)
Having some BTB, will kepp taking OCs and keep bleeding calendar

## 2015-04-30 ENCOUNTER — Other Ambulatory Visit: Payer: Self-pay | Admitting: Obstetrics and Gynecology

## 2015-04-30 DIAGNOSIS — Z1231 Encounter for screening mammogram for malignant neoplasm of breast: Secondary | ICD-10-CM

## 2015-05-03 ENCOUNTER — Ambulatory Visit (HOSPITAL_COMMUNITY)
Admission: RE | Admit: 2015-05-03 | Discharge: 2015-05-03 | Disposition: A | Discharge status: Home / Self Care | Payer: 59 | Source: Ambulatory Visit | Attending: Obstetrics and Gynecology | Admitting: Obstetrics and Gynecology

## 2015-05-03 DIAGNOSIS — Z1231 Encounter for screening mammogram for malignant neoplasm of breast: Secondary | ICD-10-CM | POA: Insufficient documentation

## 2015-05-22 ENCOUNTER — Ambulatory Visit (INDEPENDENT_AMBULATORY_CARE_PROVIDER_SITE_OTHER): Payer: 59

## 2015-05-22 DIAGNOSIS — Z111 Encounter for screening for respiratory tuberculosis: Secondary | ICD-10-CM

## 2015-05-24 LAB — TB SKIN TEST
INDURATION: 0 mm
TB Skin Test: NEGATIVE

## 2015-11-26 ENCOUNTER — Encounter: Payer: Self-pay | Admitting: Family Medicine

## 2015-11-26 ENCOUNTER — Ambulatory Visit (INDEPENDENT_AMBULATORY_CARE_PROVIDER_SITE_OTHER): Payer: 59 | Admitting: Family Medicine

## 2015-11-26 VITALS — BP 120/78 | Temp 98.3°F | Ht 63.0 in | Wt 129.5 lb

## 2015-11-26 DIAGNOSIS — J019 Acute sinusitis, unspecified: Secondary | ICD-10-CM

## 2015-11-26 DIAGNOSIS — R6889 Other general symptoms and signs: Secondary | ICD-10-CM

## 2015-11-26 DIAGNOSIS — B9689 Other specified bacterial agents as the cause of diseases classified elsewhere: Secondary | ICD-10-CM

## 2015-11-26 MED ORDER — AZITHROMYCIN 250 MG PO TABS: ORAL_TABLET | ORAL | Status: DC

## 2015-11-26 NOTE — Progress Notes (Signed)
   Subjective:    Patient ID: Betty Coleman, female    DOB: March 03, 1971, 45 y.o.   MRN: [id number]  Fever  This is a new problem. The current episode started in the past 7 days. The problem occurs intermittently. The problem has been unchanged. Associated symptoms include congestion and coughing. Associated symptoms comments: fatigue. She has tried acetaminophen for the symptoms. The treatment provided no relief.   flulike illness for detail in the last week then gradually got better but no sinus congestion drainage and coughing patient denies high fever chills sweats    Review of Systems  Constitutional: Positive for fever.  HENT: Positive for congestion.   Respiratory: Positive for cough.        Objective:   Physical Exam  lungs are clear hearts regular mild sinus tenderness eardrums normal neck no masses       Assessment & Plan:   flulike illness Secondary rhinosinusitis Antibiotics prescribed warning signs discussed

## 2015-12-31 ENCOUNTER — Other Ambulatory Visit: Payer: Self-pay | Admitting: Adult Health

## 2016-01-11 ENCOUNTER — Encounter: Payer: Self-pay | Admitting: Adult Health

## 2016-01-11 ENCOUNTER — Ambulatory Visit (INDEPENDENT_AMBULATORY_CARE_PROVIDER_SITE_OTHER): Payer: 59 | Admitting: Adult Health

## 2016-01-11 VITALS — BP 118/78 | HR 72 | Ht 62.25 in | Wt 133.0 lb

## 2016-01-11 DIAGNOSIS — Z01419 Encounter for gynecological examination (general) (routine) without abnormal findings: Secondary | ICD-10-CM

## 2016-01-11 DIAGNOSIS — Z1212 Encounter for screening for malignant neoplasm of rectum: Secondary | ICD-10-CM | POA: Diagnosis not present

## 2016-01-11 DIAGNOSIS — Z3041 Encounter for surveillance of contraceptive pills: Secondary | ICD-10-CM

## 2016-01-11 LAB — HEMOCCULT GUIAC POC 1CARD (OFFICE): FECAL OCCULT BLD: NEGATIVE

## 2016-01-11 NOTE — Progress Notes (Signed)
Patient ID: Betty Coleman, female   DOB: 03-29-1971, 45 y.o.   MRN: [id number] History of Present Illness: Mickala is a 45 year old white female, married in for well woman gyn exam, she had a normal pap with negative HPV 12/28/14.   Current Medications, Allergies, Past Medical History, Past Surgical History, Family History and Social History were reviewed in [employer] record.     Review of Systems:  Patient denies any headaches, hearing loss, fatigue, blurred vision, shortness of breath, chest pain, abdominal pain, problems with bowel movements, urination, or intercourse. No j mood swings.Has some pian right hip at times.happy with OCs.   Physical Exam:BP 118/78 mmHg  Pulse 72  Ht 5' 2.25" (1.581 m)  Wt 133 lb (60.328 kg)  BMI 24.14 kg/m2  LMP 01/01/2016 General:  Well developed, well nourished, no acute distress Skin:  Warm and dry Neck:  Midline trachea, normal thyroid, good ROM, no lymphadenopathy Lungs; Clear to auscultation bilaterally Breast:  No dominant palpable mass, retraction, or nipple discharge Cardiovascular: Regular rate and rhythm Abdomen:  Soft, non tender, no hepatosplenomegaly Pelvic:  External genitalia is normal in appearance, no lesions.  The vagina is normal in appearance. Urethra has no lesions or masses. The cervix is bulbous.  Uterus is felt to be normal size, shape, and contour.  No adnexal masses or tenderness noted.Bladder is non tender, no masses felt. Rectal: Good sphincter tone, no polyps, or hemorrhoids felt.  Hemoccult negative. Extremities/musculoskeletal:  No swelling or varicosities noted, no clubbing or cyanosis, no point tenderness on right hip or problems with ROM(try pillow between legs at night) Psych:  No mood changes, alert and cooperative,seems happy   Impression: Well woman gyn exam no pap Contraceptive management     Plan: Continue tri sprintec, has refills Mammogram yearly Physical in 1 year, pap  2019 Check CBC,CMP,TSH and lipids,A1c and vitamin D

## 2016-01-11 NOTE — Patient Instructions (Signed)
Physical in 1 year Mammogram yearly

## 2016-01-12 LAB — COMPREHENSIVE METABOLIC PANEL
A/G RATIO: 1.2 (ref 1.2–2.2)
ALBUMIN: 4.2 g/dL (ref 3.5–5.5)
ALT: 12 IU/L (ref 0–32)
AST: 20 IU/L (ref 0–40)
Alkaline Phosphatase: 53 IU/L (ref 39–117)
BUN / CREAT RATIO: 13 (ref 9–23)
BUN: 10 mg/dL (ref 6–24)
Bilirubin Total: 0.4 mg/dL (ref 0.0–1.2)
CALCIUM: 9.5 mg/dL (ref 8.7–10.2)
CO2: 23 mmol/L (ref 18–29)
Chloride: 96 mmol/L (ref 96–106)
Creatinine, Ser: 0.8 mg/dL (ref 0.57–1.00)
GFR, EST AFRICAN AMERICAN: 104 mL/min/{1.73_m2} (ref >59)
GFR, EST NON AFRICAN AMERICAN: 91 mL/min/{1.73_m2} (ref >59)
GLOBULIN, TOTAL: 3.6 g/dL (ref 1.5–4.5)
Glucose: 88 mg/dL (ref 65–99)
POTASSIUM: 4.7 mmol/L (ref 3.5–5.2)
SODIUM: 139 mmol/L (ref 134–144)
Total Protein: 7.8 g/dL (ref 6.0–8.5)

## 2016-01-12 LAB — CBC
HEMATOCRIT: 41.2 % (ref 34.0–46.6)
HEMOGLOBIN: 13.5 g/dL (ref 11.1–15.9)
MCH: 30.7 pg (ref 26.6–33.0)
MCHC: 32.8 g/dL (ref 31.5–35.7)
MCV: 94 fL (ref 79–97)
Platelets: 378 10*3/uL (ref 150–379)
RBC: 4.4 x10E6/uL (ref 3.77–5.28)
RDW: 14.2 % (ref 12.3–15.4)
WBC: 8.2 10*3/uL (ref 3.4–10.8)

## 2016-01-12 LAB — VITAMIN D 25 HYDROXY (VIT D DEFICIENCY, FRACTURES): VIT D 25 HYDROXY: 33.4 ng/mL (ref 30.0–100.0)

## 2016-01-12 LAB — HEMOGLOBIN A1C
ESTIMATED AVERAGE GLUCOSE: 117 mg/dL
HEMOGLOBIN A1C: 5.7 % — AB (ref 4.8–5.6)

## 2016-01-12 LAB — LIPID PANEL
CHOL/HDL RATIO: 3.1 ratio (ref 0.0–4.4)
Cholesterol, Total: 240 mg/dL — ABNORMAL HIGH (ref 100–199)
HDL: 77 mg/dL (ref >39)
LDL CALC: 134 mg/dL — AB (ref 0–99)
Triglycerides: 145 mg/dL (ref 0–149)
VLDL CHOLESTEROL CAL: 29 mg/dL (ref 5–40)

## 2016-01-12 LAB — TSH: TSH: 1.43 u[IU]/mL (ref 0.450–4.500)

## 2016-01-14 ENCOUNTER — Telehealth: Payer: Self-pay | Admitting: Adult Health

## 2016-01-14 NOTE — Telephone Encounter (Signed)
Left message about labs, decrease carbs and fats

## 2016-02-25 ENCOUNTER — Ambulatory Visit (INDEPENDENT_AMBULATORY_CARE_PROVIDER_SITE_OTHER): Payer: 59 | Admitting: Family Medicine

## 2016-02-25 ENCOUNTER — Encounter: Payer: Self-pay | Admitting: Family Medicine

## 2016-02-25 VITALS — BP 130/78 | Temp 98.3°F | Ht 63.0 in | Wt 133.0 lb

## 2016-02-25 DIAGNOSIS — L259 Unspecified contact dermatitis, unspecified cause: Secondary | ICD-10-CM | POA: Diagnosis not present

## 2016-02-25 DIAGNOSIS — R197 Diarrhea, unspecified: Secondary | ICD-10-CM

## 2016-02-25 MED ORDER — MOMETASONE FUROATE 0.1 % EX CREA: TOPICAL_CREAM | CUTANEOUS | Status: DC

## 2016-02-25 NOTE — Progress Notes (Signed)
   Subjective:    Patient ID: Betty Coleman, female    DOB: 08-Sep-1971, 45 y.o.   MRN: [id number]  Abdominal Pain This is a new problem. The current episode started in the past 7 days. The problem occurs intermittently. The problem has been unchanged. The pain is located in the generalized abdominal region. The pain is moderate. The quality of the pain is cramping. The abdominal pain does not radiate. Associated symptoms include nausea. Associated symptoms comments: Rash . Nothing aggravates the pain. The pain is relieved by nothing. She has tried nothing for the symptoms. The treatment provided no relief.   Patient has no other concerns at this time.   no camping no unusual sources of water no 1 around there is been sick did eat some rice it may or may not a been cooked properly  Review of Systems  Gastrointestinal: Positive for nausea and abdominal pain.   denies wheezing vomiting does relate some diarrhea no bloody stools     Objective:   Physical Exam   lungs are clear hearts regular abdomen soft no guarding rebound or tenderness. Minimal subjected discomfort midabdomen   patient also with contact dermatitis on the arms due to poison ivy recommend elocon cream does not one prednisone currently she will call us back if she does    Assessment & Plan:   intermittent abdominal pain with diarrhea possibly food related issue due to eating rice the day before possibly improperly: no need for antibiotics currently. Warning signs discussed in detail. If high fevers bloody stools or worse follow-up follow-up sooner if any other particular troubles

## 2016-08-08 ENCOUNTER — Other Ambulatory Visit: Payer: Self-pay | Admitting: Adult Health

## 2016-08-08 DIAGNOSIS — Z1231 Encounter for screening mammogram for malignant neoplasm of breast: Secondary | ICD-10-CM

## 2016-08-12 ENCOUNTER — Ambulatory Visit: Payer: 59

## 2016-08-22 ENCOUNTER — Ambulatory Visit: Payer: 59

## 2016-08-22 DIAGNOSIS — Z23 Encounter for immunization: Secondary | ICD-10-CM

## 2016-08-27 ENCOUNTER — Ambulatory Visit (HOSPITAL_COMMUNITY)
Admission: RE | Admit: 2016-08-27 | Discharge: 2016-08-27 | Disposition: A | Discharge status: Home / Self Care | Payer: 59 | Source: Ambulatory Visit | Attending: Adult Health | Admitting: Adult Health

## 2016-08-27 DIAGNOSIS — Z1231 Encounter for screening mammogram for malignant neoplasm of breast: Secondary | ICD-10-CM | POA: Insufficient documentation

## 2016-11-11 ENCOUNTER — Emergency Department (HOSPITAL_COMMUNITY)
Admission: EM | Admit: 2016-11-11 | Discharge: 2016-11-12 | Disposition: A | Discharge status: Home / Self Care | Payer: 59 | Attending: Emergency Medicine | Admitting: Emergency Medicine

## 2016-11-11 ENCOUNTER — Encounter (HOSPITAL_COMMUNITY): Payer: Self-pay

## 2016-11-11 ENCOUNTER — Emergency Department (HOSPITAL_COMMUNITY): Payer: 59

## 2016-11-11 DIAGNOSIS — R1013 Epigastric pain: Secondary | ICD-10-CM

## 2016-11-11 DIAGNOSIS — R110 Nausea: Secondary | ICD-10-CM | POA: Diagnosis not present

## 2016-11-11 DIAGNOSIS — Z79899 Other long term (current) drug therapy: Secondary | ICD-10-CM | POA: Diagnosis not present

## 2016-11-11 DIAGNOSIS — Z791 Long term (current) use of non-steroidal anti-inflammatories (NSAID): Secondary | ICD-10-CM | POA: Diagnosis not present

## 2016-11-11 DIAGNOSIS — Z7982 Long term (current) use of aspirin: Secondary | ICD-10-CM | POA: Diagnosis not present

## 2016-11-11 DIAGNOSIS — R1033 Periumbilical pain: Secondary | ICD-10-CM | POA: Diagnosis present

## 2016-11-11 LAB — CBC
HEMATOCRIT: 39.5 % (ref 36.0–46.0)
HEMOGLOBIN: 13.6 g/dL (ref 12.0–15.0)
MCH: 30.7 pg (ref 26.0–34.0)
MCHC: 34.4 g/dL (ref 30.0–36.0)
MCV: 89.2 fL (ref 78.0–100.0)
Platelets: 371 10*3/uL (ref 150–400)
RBC: 4.43 MIL/uL (ref 3.87–5.11)
RDW: 12.6 % (ref 11.5–15.5)
WBC: 17.2 10*3/uL — ABNORMAL HIGH (ref 4.0–10.5)

## 2016-11-11 LAB — URINALYSIS, ROUTINE W REFLEX MICROSCOPIC
Bilirubin Urine: NEGATIVE
GLUCOSE, UA: NEGATIVE mg/dL
Hgb urine dipstick: NEGATIVE
KETONES UR: 20 mg/dL — AB
Leukocytes, UA: NEGATIVE
NITRITE: NEGATIVE
PROTEIN: NEGATIVE mg/dL
SPECIFIC GRAVITY, URINE: 1.015 (ref 1.005–1.030)
pH: 7 (ref 5.0–8.0)

## 2016-11-11 LAB — LIPASE, BLOOD: LIPASE: 34 U/L (ref 11–51)

## 2016-11-11 LAB — COMPREHENSIVE METABOLIC PANEL
ALBUMIN: 4 g/dL (ref 3.5–5.0)
ALT: 71 U/L — ABNORMAL HIGH (ref 14–54)
ANION GAP: 10 (ref 5–15)
AST: 140 U/L — AB (ref 15–41)
Alkaline Phosphatase: 67 U/L (ref 38–126)
BUN: 10 mg/dL (ref 6–20)
CHLORIDE: 99 mmol/L — AB (ref 101–111)
CO2: 27 mmol/L (ref 22–32)
Calcium: 9.4 mg/dL (ref 8.9–10.3)
Creatinine, Ser: 0.62 mg/dL (ref 0.44–1.00)
GFR calc Af Amer: >60 mL/min (ref >60)
GFR calc non Af Amer: >60 mL/min (ref >60)
GLUCOSE: 127 mg/dL — AB (ref 65–99)
POTASSIUM: 4.1 mmol/L (ref 3.5–5.1)
Sodium: 136 mmol/L (ref 135–145)
TOTAL PROTEIN: 8 g/dL (ref 6.5–8.1)
Total Bilirubin: 0.9 mg/dL (ref 0.3–1.2)

## 2016-11-11 LAB — PREGNANCY, URINE: PREG TEST UR: NEGATIVE

## 2016-11-11 MED ORDER — IOPAMIDOL (ISOVUE-300) INJECTION 61%
100.0000 mL | Freq: Once | INTRAVENOUS | Status: AC | PRN
Start: 2016-11-11 — End: 2016-11-12
  Administered 2016-11-12: 100 mL via INTRAVENOUS

## 2016-11-11 MED ORDER — IOPAMIDOL (ISOVUE-300) INJECTION 61%
INTRAVENOUS | Status: AC
  Administered 2016-11-12: 30 mL
  Filled 2016-11-11: qty 30

## 2016-11-11 MED ORDER — ONDANSETRON HCL 4 MG/2ML IJ SOLN
4.0000 mg | Freq: Once | INTRAMUSCULAR | Status: AC
  Administered 2016-11-11: 4 mg via INTRAVENOUS
  Filled 2016-11-11: qty 2

## 2016-11-11 MED ORDER — SODIUM CHLORIDE 0.9 % IV BOLUS (SEPSIS)
1000.0000 mL | Freq: Once | INTRAVENOUS | Status: AC
  Administered 2016-11-11: 1000 mL via INTRAVENOUS

## 2016-11-11 NOTE — ED Triage Notes (Signed)
I was hurting under my ribs, reminded me of a gallbladder attack but I no longer have a gallbladder.  Patient states that she feels like she has a knot in her epigastric area.  Was seen at urgent care and they did an EKG but with my other symptoms that I should come be checked out here.  They did not believe it to be cardiac.

## 2016-11-11 NOTE — ED Provider Notes (Signed)
[facility] DEPT Provider Note   CSN: [id number] Arrival date & time: 11/11/16  2013  By signing my name below, I, Hilbert Odor, attest that this documentation has been prepared under the direction and in the presence of Orpah Greek, MD. Electronically Signed: Hilbert Odor, Scribe. 11/11/16. 11:54 PM. History   Chief Complaint Chief Complaint  Patient presents with  . Abdominal Pain    The history is provided by the patient. No language interpreter was used.   HPI Comments: Betty Coleman is a 46 y.o. female who presents to the Emergency Department complaining of abdominal pain that began around 2:30 pm today. She also reports some nausea. She states that she was seen at urgent care and was advised to come here for an evaluation. She states that the pain began around her umbilical region and radiated to under her ribs. She states that the pain is similar to when she had problems with her gall bladder in the past. She reports having her gall bladder removed in 2005. She reports that her pain is mostly resolved but is still present. She denies hx of pancreatitis. She reports no alleviating factors.  Past Medical History:  Diagnosis Date  . Contraceptive management 11/29/2013  . Internal hemorrhage 11/29/2013   Had + hemoccult will send 3 cards home     Patient Active Problem List   Diagnosis Date Noted  . Internal hemorrhoids 11/29/2013  . Contraceptive management 11/29/2013    Past Surgical History:  Procedure Laterality Date  . CHOLECYSTECTOMY      OB History    Gravida Para Term Preterm AB Living   2 2       2    SAB TAB Ectopic Multiple Live Births           2       Home Medications    Prior to Admission medications   Medication Sig Start Date End Date Taking? Authorizing Provider  aspirin EC 325 MG tablet Take 325 mg by mouth once.   Yes Historical Provider, MD  ibuprofen (ADVIL,MOTRIN) 200 MG tablet Take 200 mg by mouth every 6 (six) hours as  needed for mild pain or moderate pain.    Yes Historical Provider, MD  Multiple Vitamins-Minerals (MULTIVITAMIN GUMMIES ADULT PO) Take by mouth daily.   Yes Historical Provider, MD  TRI-SPRINTEC 0.18/0.215/0.25 MG-35 MCG tablet TAKE ONE TABLET BY MOUTH ONCE DAILY 12/31/15  Yes Estill Dooms, NP  pantoprazole (PROTONIX) 20 MG tablet Take 1 tablet (20 mg total) by mouth daily. 11/12/16   Orpah Greek, MD  sucralfate (CARAFATE) 1 GM/10ML suspension Take 10 mLs (1 g total) by mouth 4 (four) times daily -  with meals and at bedtime. 11/12/16   Orpah Greek, MD    Family History Family History  Problem Relation Age of Onset  . Arthritis Mother   . Hypertension Mother   . Heart attack Mother   . Heart disease Maternal Uncle   . Heart disease Maternal Grandmother   . Heart disease Maternal Grandfather   . Cancer Paternal Grandfather     melanoma    Social History Social History  Substance Use Topics  . Smoking status: Never Smoker  . Smokeless tobacco: Never Used  . Alcohol use Yes     Comment: occassional     Allergies   Patient has no known allergies.   Review of Systems Review of Systems  Constitutional: Negative for fever.  Cardiovascular: Negative for chest pain.  Gastrointestinal:  Positive for abdominal pain and nausea.  All other systems reviewed and are negative.    Physical Exam Updated Vital Signs BP 150/82 (BP Location: Left Arm)   Pulse 87   Temp 98.6 F (37 C) (Oral)   Resp 20   Ht 5' 3"  (1.6 m)   Wt 135 lb 8 oz (61.5 kg)   LMP 11/05/2016 (Exact Date)   SpO2 100%   BMI 24.00 kg/m   Physical Exam  Constitutional: She is oriented to person, place, and time. She appears well-developed and well-nourished. No distress.  HENT:  Head: Normocephalic and atraumatic.  Right Ear: Hearing normal.  Left Ear: Hearing normal.  Nose: Nose normal.  Mouth/Throat: Oropharynx is clear and moist and mucous membranes are normal.  Eyes: Conjunctivae  and EOM are normal. Pupils are equal, round, and reactive to light.  Neck: Normal range of motion. Neck supple.  Cardiovascular: Regular rhythm, S1 normal and S2 normal.  Exam reveals no gallop and no friction rub.   No murmur heard. Pulmonary/Chest: Effort normal and breath sounds normal. No respiratory distress. She exhibits no tenderness.  Abdominal: Soft. Normal appearance and bowel sounds are normal. There is no hepatosplenomegaly. There is no tenderness. There is no rebound, no guarding, no tenderness at McBurney's point and negative Murphy's sign. No hernia.  Musculoskeletal: Normal range of motion.  Neurological: She is alert and oriented to person, place, and time. She has normal strength. No cranial nerve deficit or sensory deficit. Coordination normal. GCS eye subscore is 4. GCS verbal subscore is 5. GCS motor subscore is 6.  Skin: Skin is warm, dry and intact. No rash noted. No cyanosis.  Psychiatric: She has a normal mood and affect. Her speech is normal and behavior is normal. Thought content normal.  Nursing note and vitals reviewed.    ED Treatments / Results  DIAGNOSTIC STUDIES: Oxygen Saturation is 100% on RA, normal by my interpretation.    COORDINATION OF CARE: 11:24 PM Discussed treatment plan with pt at bedside, which includes labs and CT of abdomen, and pt agreed to plan.  Labs (all labs ordered are listed, but only abnormal results are displayed) Labs Reviewed  COMPREHENSIVE METABOLIC PANEL - Abnormal; Notable for the following:       Result Value   Chloride 99 (*)    Glucose, Bld 127 (*)    AST 140 (*)    ALT 71 (*)    All other components within normal limits  CBC - Abnormal; Notable for the following:    WBC 17.2 (*)    All other components within normal limits  URINALYSIS, ROUTINE W REFLEX MICROSCOPIC - Abnormal; Notable for the following:    APPearance CLOUDY (*)    Ketones, ur 20 (*)    All other components within normal limits  LIPASE, BLOOD    PREGNANCY, URINE    EKG  EKG Interpretation  Date/Time:  Tuesday November 11 2016 20:18:17 EST Ventricular Rate:  85 PR Interval:  116 QRS Duration: 74 QT Interval:  386 QTC Calculation: 459 R Axis:   77 Text Interpretation:  Normal sinus rhythm Normal ECG Confirmed by Pinki Rottman  MD, Ashantae Pangallo [phone]) on 11/11/2016 11:25:34 PM       Radiology Ct Abdomen Pelvis W Contrast  Result Date: 11/12/2016 CLINICAL DATA:  Abdominal pain EXAM: CT ABDOMEN AND PELVIS WITH CONTRAST TECHNIQUE: Multidetector CT imaging of the abdomen and pelvis was performed using the standard protocol following bolus administration of intravenous contrast. CONTRAST:  84m ISOVUE-300 IOPAMIDOL (  ISOVUE-300) INJECTION 61%, 162m ISOVUE-300 IOPAMIDOL (ISOVUE-300) INJECTION 61% COMPARISON:  Abdominal ultrasound 02/16/2004 FINDINGS: Lower chest: No pulmonary nodules. No visible pleural or pericardial effusion. Hepatobiliary: Normal hepatic size and contours without focal liver lesion. No perihepatic ascites. No intra- or extrahepatic biliary dilatation. Status post cholecystectomy. Pancreas: Normal pancreatic contours and enhancement. No peripancreatic fluid collection or pancreatic ductal dilatation. Spleen: Normal. Adrenals/Urinary Tract: Normal adrenal glands. No hydronephrosis or solid renal mass. Stomach/Bowel: No abnormal bowel dilatation. No bowel wall thickening or adjacent fat stranding to indicate acute inflammation. No abdominal fluid collection. Normal appendix. Vascular/Lymphatic: Normal course and caliber of the major abdominal vessels. No abdominal or pelvic adenopathy. Reproductive: Normal uterus and ovaries. Musculoskeletal: No lytic or blastic osseous lesion. Normal visualized extrathoracic and extraperitoneal soft tissues. Other: No contributory non-categorized findings. IMPRESSION: No acute abnormality of the abdomen or pelvis. Electronically Signed   By: KUlyses JarredM.D.   On: 11/12/2016 00:44     Procedures Procedures (including critical care time)  Medications Ordered in ED Medications  sodium chloride 0.9 % bolus 1,000 mL (1,000 mLs Intravenous New Bag/Given 11/11/16 2352)  ondansetron (ZOFRAN) injection 4 mg (4 mg Intravenous Given 11/11/16 2352)  iopamidol (ISOVUE-300) 61 % injection (30 mLs  Contrast Given 11/12/16 0022)  iopamidol (ISOVUE-300) 61 % injection 100 mL (100 mLs Intravenous Contrast Given 11/12/16 0022)     Initial Impression / Assessment and Plan / ED Course  I have reviewed the triage vital signs and the nursing notes.  Pertinent labs & imaging results that were available during my care of the patient were reviewed by me and considered in my medical decision making (see chart for details).     Patient presents to the emergency department with complaints of intermittent epigastric pain has occurred 3 times today. He was initially brought on by eating. She reports that the pain felt similar to when she had biliary colic, but has had her gallbladder taken out. Patient continues to have mild epigastric pain and there is epigastric tenderness on examination. No signs of peritonitis. She does have leukocytosis, unclear etiology. Urinalysis does not suggest infection. CT scan performed does not show any acute pathology. She also had slightly elevated LFTs. He'll need to follow up with primary care physician for these findings, will treat for likely gastritis/GERD.  Final Clinical Impressions(s) / ED Diagnoses   Final diagnoses:  Epigastric pain    New Prescriptions New Prescriptions   PANTOPRAZOLE (PROTONIX) 20 MG TABLET    Take 1 tablet (20 mg total) by mouth daily.   SUCRALFATE (CARAFATE) 1 GM/10ML SUSPENSION    Take 10 mLs (1 g total) by mouth 4 (four) times daily -  with meals and at bedtime.   I personally performed the services described in this documentation, which was scribed in my presence. The recorded information has been reviewed and is accurate.     COrpah Greek MD 11/12/16 [phone]

## 2016-11-12 MED ORDER — SUCRALFATE 1 GM/10ML PO SUSP: 1.0000 g | Freq: Three times a day (TID) | ORAL | 0 refills | Status: DC

## 2016-11-12 MED ORDER — PANTOPRAZOLE SODIUM 20 MG PO TBEC: 20.0000 mg | DELAYED_RELEASE_TABLET | Freq: Every day | ORAL | 0 refills | Status: DC

## 2016-11-12 NOTE — ED Notes (Signed)
Pt alert & oriented x4, stable gait. Patient given discharge instructions, paperwork & prescription(s). Patient  instructed to stop at the registration desk to finish any additional paperwork. Patient verbalized understanding. Pt left department w/ no further questions. 

## 2016-11-12 NOTE — ED Notes (Signed)
Patient walked to restroom. Patient states that her stomach hurts but its hunger pains.

## 2016-12-06 ENCOUNTER — Other Ambulatory Visit: Payer: Self-pay | Admitting: Adult Health

## 2017-01-12 ENCOUNTER — Encounter: Payer: Self-pay | Admitting: Adult Health

## 2017-01-12 ENCOUNTER — Ambulatory Visit (INDEPENDENT_AMBULATORY_CARE_PROVIDER_SITE_OTHER): Payer: 59 | Admitting: Adult Health

## 2017-01-12 VITALS — BP 130/70 | HR 70 | Ht 62.0 in | Wt 135.5 lb

## 2017-01-12 DIAGNOSIS — Z1211 Encounter for screening for malignant neoplasm of colon: Secondary | ICD-10-CM

## 2017-01-12 DIAGNOSIS — R7309 Other abnormal glucose: Secondary | ICD-10-CM

## 2017-01-12 DIAGNOSIS — Z1212 Encounter for screening for malignant neoplasm of rectum: Secondary | ICD-10-CM | POA: Diagnosis not present

## 2017-01-12 DIAGNOSIS — R748 Abnormal levels of other serum enzymes: Secondary | ICD-10-CM

## 2017-01-12 DIAGNOSIS — D72829 Elevated white blood cell count, unspecified: Secondary | ICD-10-CM

## 2017-01-12 DIAGNOSIS — Z13220 Encounter for screening for lipoid disorders: Secondary | ICD-10-CM

## 2017-01-12 DIAGNOSIS — Z3041 Encounter for surveillance of contraceptive pills: Secondary | ICD-10-CM

## 2017-01-12 DIAGNOSIS — Z01419 Encounter for gynecological examination (general) (routine) without abnormal findings: Secondary | ICD-10-CM | POA: Diagnosis not present

## 2017-01-12 LAB — HEMOCCULT GUIAC POC 1CARD (OFFICE): Fecal Occult Blood, POC: NEGATIVE

## 2017-01-12 MED ORDER — NORGESTIM-ETH ESTRAD TRIPHASIC 0.18/0.215/0.25 MG-35 MCG PO TABS: 1.0000 | ORAL_TABLET | Freq: Every day | ORAL | 4 refills | Status: DC

## 2017-01-12 NOTE — Progress Notes (Signed)
Patient ID: [name], female   DOB: Jan 24, 1971, 46 y.o.   MRN: [id number] History of Present Illness: Iola is a 46 year old white female, married in for a well woman gyn exam,she had a normal pap with negative HPV 12/28/14.She was seen in ER in February for chest pain and had elevated LFTs and WBC, no pain since.  PCP is [facility].   Current Medications, Allergies, Past Medical History, Past Surgical History, Family History and Social History were reviewed in [employer] record.     Review of Systems: Patient denies any headaches, hearing loss, fatigue, blurred vision, shortness of breath, chest pain, abdominal pain, problems with bowel movements, urination, or intercourse. No joint pain or mood swings.    Physical Exam:BP 130/70 (BP Location: Left Arm, Patient Position: Sitting, Cuff Size: Normal)   Pulse 70   Ht 5' 2"  (1.575 m)   Wt 135 lb 8 oz (61.5 kg)   LMP 12/31/2016 (Approximate)   BMI 24.78 kg/m  General:  Well developed, well nourished, no acute distress Skin:  Warm and dry Neck:  Midline trachea, normal thyroid, good ROM, no lymphadenopathy Lungs; Clear to auscultation bilaterally Breast:  No dominant palpable mass, retraction, or nipple discharge Cardiovascular: Regular rate and rhythm Abdomen:  Soft, non tender, no hepatosplenomegaly Pelvic:  External genitalia is normal in appearance, no lesions.  The vagina is normal in appearance. Urethra has no lesions or masses. The cervix is bulbous.  Uterus is felt to be normal size, shape, and contour.  No adnexal masses or tenderness noted.Bladder is non tender, no masses felt. Rectal: Good sphincter tone, no polyps, or hemorrhoids felt.  Hemoccult negative. Extremities/musculoskeletal:  No swelling or varicosities noted, no clubbing or cyanosis Psych:  No mood changes, alert and cooperative,seems happy PHQ 2 score 0. Will check labs fasting this week.  Impression: 1. Well woman exam with routine  gynecological exam   2. Encounter for surveillance of contraceptive pills   3. Screening for colorectal cancer   4. Elevated hemoglobin A1c   5. Screening cholesterol level   6. Leukocytosis, unspecified type   7. Elevated liver enzymes       Plan: Check CBC,CMP,TSH and lipids,A1c  Physical with pap in 1 year Mammogram yearly Refilled Tri Sprintec #84 take 1 daily with 4 refills

## 2017-01-17 LAB — COMPREHENSIVE METABOLIC PANEL
A/G RATIO: 1.3 (ref 1.2–2.2)
ALK PHOS: 48 IU/L (ref 39–117)
ALT: 13 IU/L (ref 0–32)
AST: 17 IU/L (ref 0–40)
Albumin: 4.1 g/dL (ref 3.5–5.5)
BILIRUBIN TOTAL: 0.3 mg/dL (ref 0.0–1.2)
BUN/Creatinine Ratio: 13 (ref 9–23)
BUN: 10 mg/dL (ref 6–24)
CALCIUM: 9.2 mg/dL (ref 8.7–10.2)
CO2: 25 mmol/L (ref 18–29)
Chloride: 97 mmol/L (ref 96–106)
Creatinine, Ser: 0.8 mg/dL (ref 0.57–1.00)
GFR calc Af Amer: 104 mL/min/{1.73_m2} (ref >59)
GFR, EST NON AFRICAN AMERICAN: 90 mL/min/{1.73_m2} (ref >59)
GLOBULIN, TOTAL: 3.1 g/dL (ref 1.5–4.5)
Glucose: 84 mg/dL (ref 65–99)
POTASSIUM: 4.6 mmol/L (ref 3.5–5.2)
SODIUM: 136 mmol/L (ref 134–144)
Total Protein: 7.2 g/dL (ref 6.0–8.5)

## 2017-01-17 LAB — CBC
HEMATOCRIT: 39.1 % (ref 34.0–46.6)
Hemoglobin: 13.3 g/dL (ref 11.1–15.9)
MCH: 30.7 pg (ref 26.6–33.0)
MCHC: 34 g/dL (ref 31.5–35.7)
MCV: 90 fL (ref 79–97)
Platelets: 317 10*3/uL (ref 150–379)
RBC: 4.33 x10E6/uL (ref 3.77–5.28)
RDW: 13.5 % (ref 12.3–15.4)
WBC: 6.3 10*3/uL (ref 3.4–10.8)

## 2017-01-17 LAB — TSH: TSH: 1.24 u[IU]/mL (ref 0.450–4.500)

## 2017-01-17 LAB — LIPID PANEL
CHOLESTEROL TOTAL: 221 mg/dL — AB (ref 100–199)
Chol/HDL Ratio: 3.1 ratio (ref 0.0–4.4)
HDL: 72 mg/dL (ref >39)
LDL CALC: 116 mg/dL — AB (ref 0–99)
Triglycerides: 167 mg/dL — ABNORMAL HIGH (ref 0–149)
VLDL Cholesterol Cal: 33 mg/dL (ref 5–40)

## 2017-01-17 LAB — HEMOGLOBIN A1C
ESTIMATED AVERAGE GLUCOSE: 114 mg/dL
Hgb A1c MFr Bld: 5.6 % (ref 4.8–5.6)

## 2017-01-20 ENCOUNTER — Telehealth: Payer: Self-pay | Admitting: Adult Health

## 2017-01-20 NOTE — Telephone Encounter (Signed)
Left message that labs look good

## 2017-09-04 ENCOUNTER — Other Ambulatory Visit: Payer: Self-pay | Admitting: Adult Health

## 2017-09-04 DIAGNOSIS — Z1231 Encounter for screening mammogram for malignant neoplasm of breast: Secondary | ICD-10-CM

## 2017-09-07 ENCOUNTER — Ambulatory Visit (HOSPITAL_COMMUNITY)
Admission: RE | Admit: 2017-09-07 | Discharge: 2017-09-07 | Disposition: A | Discharge status: Home / Self Care | Payer: 59 | Source: Ambulatory Visit | Attending: Adult Health | Admitting: Adult Health

## 2017-09-07 DIAGNOSIS — Z1231 Encounter for screening mammogram for malignant neoplasm of breast: Secondary | ICD-10-CM | POA: Diagnosis present

## 2017-09-10 ENCOUNTER — Ambulatory Visit (INDEPENDENT_AMBULATORY_CARE_PROVIDER_SITE_OTHER): Payer: 59 | Admitting: *Deleted

## 2017-09-10 DIAGNOSIS — Z23 Encounter for immunization: Secondary | ICD-10-CM

## 2018-01-26 ENCOUNTER — Other Ambulatory Visit: Payer: Self-pay | Admitting: Adult Health

## 2018-05-21 ENCOUNTER — Other Ambulatory Visit: Payer: Self-pay | Admitting: Adult Health

## 2018-08-15 ENCOUNTER — Other Ambulatory Visit: Payer: Self-pay | Admitting: Adult Health

## 2018-08-25 ENCOUNTER — Ambulatory Visit (INDEPENDENT_AMBULATORY_CARE_PROVIDER_SITE_OTHER): Payer: 59 | Admitting: Family Medicine

## 2018-08-25 ENCOUNTER — Other Ambulatory Visit: Payer: Self-pay | Admitting: Family Medicine

## 2018-08-25 VITALS — BP 130/82 | Temp 98.2°F | Wt 134.4 lb

## 2018-08-25 DIAGNOSIS — B349 Viral infection, unspecified: Secondary | ICD-10-CM | POA: Diagnosis not present

## 2018-08-25 MED ORDER — AMOXICILLIN 500 MG PO CAPS: 500.0000 mg | ORAL_CAPSULE | Freq: Three times a day (TID) | ORAL | 0 refills | Status: AC

## 2018-08-25 NOTE — Progress Notes (Signed)
   Subjective:    Patient ID: Betty Coleman, female    DOB: 10-27-1970, 47 y.o.   MRN: [id number]  Cough  This is a new problem. The current episode started in the past 7 days. Associated symptoms include myalgias, nasal congestion, postnasal drip and a sore throat. Pertinent negatives include no chills, ear pain, fever, shortness of breath or wheezing. Associated symptoms comments: Lost voice. Treatments tried: advil, gargles and otc meds.   6 day  Hx of sore throat, body aches, and PND. Cough, non productive. Lost her voice 3 days ago. No fever or chills. Today sore throat more intermittently.    Review of Systems  Constitutional: Negative for chills and fever.  HENT: Positive for congestion, postnasal drip and sore throat. Negative for ear pain, sinus pressure and sinus pain.   Respiratory: Positive for cough. Negative for shortness of breath and wheezing.   Gastrointestinal: Negative for nausea and vomiting.  Musculoskeletal: Positive for myalgias.       Objective:   Physical Exam  Constitutional: She is oriented to person, place, and time. She appears well-developed and well-nourished. No distress.  HENT:  Head: Normocephalic and atraumatic.  Right Ear: Tympanic membrane and external ear normal.  Left Ear: Tympanic membrane and external ear normal.  Nose: Nose normal. No sinus tenderness.  Mouth/Throat: Uvula is midline. Posterior oropharyngeal erythema (mild) present.  Eyes: Right eye exhibits no discharge. Left eye exhibits no discharge.  Neck: Neck supple.  Cardiovascular: Normal rate, regular rhythm and normal heart sounds.  Pulmonary/Chest: Effort normal and breath sounds normal. No respiratory distress. She has no wheezes. She has no rales.  Lymphadenopathy:    She has no cervical adenopathy.  Neurological: She is alert and oriented to person, place, and time.  Skin: Skin is warm and dry.  Psychiatric: She has a normal mood and affect.  Nursing note and vitals  reviewed.      Assessment & Plan:  Acute viral syndrome Discussed likely viral etiology.  Recommend symptomatic treatment.  Patient going out of town this weekend, prescription for amoxicillin printed.  Discussed with patient to give it 24 to 48 hours if symptoms are not improving may fill antibiotic.  She verbalized understanding.  Warning signs discussed, follow-up as needed.  Dr. Sallee Lange was consulted on this case and is in agreement with the above treatment plan.

## 2018-09-10 ENCOUNTER — Ambulatory Visit: Payer: 59

## 2018-10-08 ENCOUNTER — Other Ambulatory Visit (HOSPITAL_COMMUNITY): Payer: Self-pay | Admitting: Adult Health

## 2018-10-08 DIAGNOSIS — Z1231 Encounter for screening mammogram for malignant neoplasm of breast: Secondary | ICD-10-CM

## 2018-10-13 ENCOUNTER — Other Ambulatory Visit (HOSPITAL_COMMUNITY)
Admission: RE | Admit: 2018-10-13 | Discharge: 2018-10-13 | Disposition: A | Discharge status: Home / Self Care | Payer: 59 | Source: Ambulatory Visit | Attending: Adult Health | Admitting: Adult Health

## 2018-10-13 ENCOUNTER — Encounter: Payer: Self-pay | Admitting: Adult Health

## 2018-10-13 ENCOUNTER — Ambulatory Visit (INDEPENDENT_AMBULATORY_CARE_PROVIDER_SITE_OTHER): Payer: 59 | Admitting: Adult Health

## 2018-10-13 VITALS — BP 144/91 | HR 75 | Ht 62.0 in | Wt 137.0 lb

## 2018-10-13 DIAGNOSIS — R030 Elevated blood-pressure reading, without diagnosis of hypertension: Secondary | ICD-10-CM | POA: Insufficient documentation

## 2018-10-13 DIAGNOSIS — Z01419 Encounter for gynecological examination (general) (routine) without abnormal findings: Secondary | ICD-10-CM | POA: Diagnosis not present

## 2018-10-13 DIAGNOSIS — Z1211 Encounter for screening for malignant neoplasm of colon: Secondary | ICD-10-CM | POA: Insufficient documentation

## 2018-10-13 DIAGNOSIS — Z1212 Encounter for screening for malignant neoplasm of rectum: Secondary | ICD-10-CM

## 2018-10-13 DIAGNOSIS — Z3041 Encounter for surveillance of contraceptive pills: Secondary | ICD-10-CM | POA: Insufficient documentation

## 2018-10-13 LAB — HEMOCCULT GUIAC POC 1CARD (OFFICE): Fecal Occult Blood, POC: NEGATIVE

## 2018-10-13 MED ORDER — NORGESTIM-ETH ESTRAD TRIPHASIC 0.18/0.215/0.25 MG-35 MCG PO TABS
1.0000 | ORAL_TABLET | Freq: Every day | ORAL | 4 refills | Status: DC
Start: 2018-10-13 — End: 2018-11-19

## 2018-10-13 NOTE — Progress Notes (Signed)
Patient ID: Betty Coleman, female   DOB: [birth year], 48 y.o.   MRN: [id number] History of Present Illness: Betty Coleman is a 48 year old white female, married, G2P2 in for a well woman gyn exam and pap. PCP is Dr Sallee Lange.    Current Medications, Allergies, Past Medical History, Past Surgical History, Family History and Social History were reviewed in [employer] record.     Review of Systems: Patient denies any headaches, hearing loss, fatigue, blurred vision, shortness of breath, chest pain, abdominal pain, problems with bowel movements, urination, or intercourse. No joint pain or mood swings. Periods heavy like 1 day then light.    Physical Exam:BP (!) 144/91 (BP Location: Right Arm, Patient Position: Sitting, Cuff Size: Normal)   Pulse 75   Ht 5' 2"  (1.575 m)   Wt 137 lb (62.1 kg)   LMP 10/06/2018 (Approximate)   BMI 25.06 kg/m  General:  Well developed, well nourished, no acute distress Skin:  Warm and dry Neck:  Midline trachea, normal thyroid, good ROM, no lymphadenopathy Lungs; Clear to auscultation bilaterally Breast:  No dominant palpable mass, retraction, or nipple discharge Cardiovascular: Regular rate and rhythm Abdomen:  Soft, non tender, no hepatosplenomegaly Pelvic:  External genitalia is normal in appearance, no lesions.  The vagina is normal in appearance. Urethra has no lesions or masses. The cervix is smooth, pap with HPV performed. Uterus is felt to be normal size, shape, and contour.  No adnexal masses or tenderness noted.Bladder is non tender, no masses felt. Rectal: Good sphincter tone, no polyps, or hemorrhoids felt.  Hemoccult negative. Extremities/musculoskeletal:  No swelling or varicosities noted, no clubbing or cyanosis Psych:  No mood changes, alert and cooperative,seems happy Fall risk is low. PHQ 2 score 0. Examination chaperoned by Levy Pupa LPN.   Impression:  1. Encounter for gynecological examination with Papanicolaou  smear of cervix   2. Screening for colorectal cancer   3. Encounter for surveillance of contraceptive pills   4. Elevated BP without diagnosis of hypertension      Plan: Try DASH diet Meds ordered this encounter  Medications  . Norgestimate-Ethinyl Estradiol Triphasic (TRI-SPRINTEC) 0.18/0.215/0.25 MG-35 MCG tablet    Sig: Take 1 tablet by mouth daily.    Dispense:  84 tablet    Refill:  4    Please consider 90 day supplies to promote better adherence    Order Specific Question:   Supervising Provider    Answer:   Tania Ade H [[id number]]  Follow up in 4 weeks for BP check, monitor at home If BP still elevated, may change OCs,and recheck BP Mammogram in am and yearly Labs fasting at next visit Physical in 1 year Pap in 3 if normal  Colonoscopy at 51

## 2018-10-13 NOTE — Patient Instructions (Signed)
DASH Eating Plan  DASH stands for "Dietary Approaches to Stop Hypertension." The DASH eating plan is a healthy eating plan that has been shown to reduce high blood pressure (hypertension). It may also reduce your risk for type 2 diabetes, heart disease, and stroke. The DASH eating plan may also help with weight loss.  What are tips for following this plan?    General guidelines   Avoid eating more than 2,300 mg (milligrams) of salt (sodium) a day. If you have hypertension, you may need to reduce your sodium intake to 1,500 mg a day.   Limit alcohol intake to no more than 1 drink a day for nonpregnant women and 2 drinks a day for men. One drink equals 12 oz of beer, 5 oz of wine, or 1 oz of hard liquor.   Work with your health care provider to maintain a healthy body weight or to lose weight. Ask what an ideal weight is for you.   Get at least 30 minutes of exercise that causes your heart to beat faster (aerobic exercise) most days of the week. Activities may include walking, swimming, or biking.   Work with your health care provider or diet and nutrition specialist (dietitian) to adjust your eating plan to your individual calorie needs.  Reading food labels     Check food labels for the amount of sodium per serving. Choose foods with less than 5 percent of the Daily Value of sodium. Generally, foods with less than 300 mg of sodium per serving fit into this eating plan.   To find whole grains, look for the word "whole" as the first word in the ingredient list.  Shopping   Buy products labeled as "low-sodium" or "no salt added."   Buy fresh foods. Avoid canned foods and premade or frozen meals.  Cooking   Avoid adding salt when cooking. Use salt-free seasonings or herbs instead of table salt or sea salt. Check with your health care provider or pharmacist before using salt substitutes.   Do not fry foods. Cook foods using healthy methods such as baking, boiling, grilling, and broiling instead.   Cook with  heart-healthy oils, such as olive, canola, soybean, or sunflower oil.  Meal planning   Eat a balanced diet that includes:  ? 5 or more servings of fruits and vegetables each day. At each meal, try to fill half of your plate with fruits and vegetables.  ? Up to 6-8 servings of whole grains each day.  ? Less than 6 oz of lean meat, poultry, or fish each day. A 3-oz serving of meat is about the same size as a deck of cards. One egg equals 1 oz.  ? 2 servings of low-fat dairy each day.  ? A serving of nuts, seeds, or beans 5 times each week.  ? Heart-healthy fats. Healthy fats called Omega-3 fatty acids are found in foods such as flaxseeds and coldwater fish, like sardines, salmon, and mackerel.   Limit how much you eat of the following:  ? Canned or prepackaged foods.  ? Food that is high in trans fat, such as fried foods.  ? Food that is high in saturated fat, such as fatty meat.  ? Sweets, desserts, sugary drinks, and other foods with added sugar.  ? Full-fat dairy products.   Do not salt foods before eating.   Try to eat at least 2 vegetarian meals each week.   Eat more home-cooked food and less restaurant, buffet, and fast food.     When eating at a restaurant, ask that your food be prepared with less salt or no salt, if possible.  What foods are recommended?  The items listed may not be a complete list. Talk with your dietitian about what dietary choices are best for you.  Grains  Whole-grain or whole-wheat bread. Whole-grain or whole-wheat pasta. Brown rice. Oatmeal. Quinoa. Bulgur. Whole-grain and low-sodium cereals. Pita bread. Low-fat, low-sodium crackers. Whole-wheat flour tortillas.  Vegetables  Fresh or frozen vegetables (raw, steamed, roasted, or grilled). Low-sodium or reduced-sodium tomato and vegetable juice. Low-sodium or reduced-sodium tomato sauce and tomato paste. Low-sodium or reduced-sodium canned vegetables.  Fruits  All fresh, dried, or frozen fruit. Canned fruit in natural juice (without  added sugar).  Meat and other protein foods  Skinless chicken or turkey. Ground chicken or turkey. Pork with fat trimmed off. Fish and seafood. Egg whites. Dried beans, peas, or lentils. Unsalted nuts, nut butters, and seeds. Unsalted canned beans. Lean cuts of beef with fat trimmed off. Low-sodium, lean deli meat.  Dairy  Low-fat (1%) or fat-free (skim) milk. Fat-free, low-fat, or reduced-fat cheeses. Nonfat, low-sodium ricotta or cottage cheese. Low-fat or nonfat yogurt. Low-fat, low-sodium cheese.  Fats and oils  Soft margarine without trans fats. Vegetable oil. Low-fat, reduced-fat, or light mayonnaise and salad dressings (reduced-sodium). Canola, safflower, olive, soybean, and sunflower oils. Avocado.  Seasoning and other foods  Herbs. Spices. Seasoning mixes without salt. Unsalted popcorn and pretzels. Fat-free sweets.  What foods are not recommended?  The items listed may not be a complete list. Talk with your dietitian about what dietary choices are best for you.  Grains  Baked goods made with fat, such as croissants, muffins, or some breads. Dry pasta or rice meal packs.  Vegetables  Creamed or fried vegetables. Vegetables in a cheese sauce. Regular canned vegetables (not low-sodium or reduced-sodium). Regular canned tomato sauce and paste (not low-sodium or reduced-sodium). Regular tomato and vegetable juice (not low-sodium or reduced-sodium). Pickles. Olives.  Fruits  Canned fruit in a light or heavy syrup. Fried fruit. Fruit in cream or butter sauce.  Meat and other protein foods  Fatty cuts of meat. Ribs. Fried meat. Bacon. Sausage. Bologna and other processed lunch meats. Salami. Fatback. Hotdogs. Bratwurst. Salted nuts and seeds. Canned beans with added salt. Canned or smoked fish. Whole eggs or egg yolks. Chicken or turkey with skin.  Dairy  Whole or 2% milk, cream, and half-and-half. Whole or full-fat cream cheese. Whole-fat or sweetened yogurt. Full-fat cheese. Nondairy creamers. Whipped toppings.  Processed cheese and cheese spreads.  Fats and oils  Butter. Stick margarine. Lard. Shortening. Ghee. Bacon fat. Tropical oils, such as coconut, palm kernel, or palm oil.  Seasoning and other foods  Salted popcorn and pretzels. Onion salt, garlic salt, seasoned salt, table salt, and sea salt. Worcestershire sauce. Tartar sauce. Barbecue sauce. Teriyaki sauce. Soy sauce, including reduced-sodium. Steak sauce. Canned and packaged gravies. Fish sauce. Oyster sauce. Cocktail sauce. Horseradish that you find on the shelf. Ketchup. Mustard. Meat flavorings and tenderizers. Bouillon cubes. Hot sauce and Tabasco sauce. Premade or packaged marinades. Premade or packaged taco seasonings. Relishes. Regular salad dressings.  Where to find more information:   National Heart, Lung, and Blood Institute: www.nhlbi.nih.gov   American Heart Association: www.heart.org  Summary   The DASH eating plan is a healthy eating plan that has been shown to reduce high blood pressure (hypertension). It may also reduce your risk for type 2 diabetes, heart disease, and stroke.   With the   DASH eating plan, you should limit salt (sodium) intake to 2,300 mg a day. If you have hypertension, you may need to reduce your sodium intake to 1,500 mg a day.   When on the DASH eating plan, aim to eat more fresh fruits and vegetables, whole grains, lean proteins, low-fat dairy, and heart-healthy fats.   Work with your health care provider or diet and nutrition specialist (dietitian) to adjust your eating plan to your individual calorie needs.  This information is not intended to replace advice given to you by your health care provider. Make sure you discuss any questions you have with your health care provider.  Document Released: 08/28/2011 Document Revised: 09/01/2016 Document Reviewed: 09/01/2016  Elsevier Interactive Patient Education  2019 Elsevier Inc.

## 2018-10-14 ENCOUNTER — Encounter (HOSPITAL_COMMUNITY): Payer: Self-pay

## 2018-10-14 ENCOUNTER — Ambulatory Visit (HOSPITAL_COMMUNITY)
Admission: RE | Admit: 2018-10-14 | Discharge: 2018-10-14 | Disposition: A | Discharge status: Home / Self Care | Payer: 59 | Source: Ambulatory Visit | Attending: Adult Health | Admitting: Adult Health

## 2018-10-14 DIAGNOSIS — Z1231 Encounter for screening mammogram for malignant neoplasm of breast: Secondary | ICD-10-CM

## 2018-10-15 ENCOUNTER — Telehealth: Payer: Self-pay | Admitting: Adult Health

## 2018-10-15 NOTE — Telephone Encounter (Signed)
Left message that Mammogram is normal

## 2018-10-18 LAB — CYTOLOGY - PAP
ADEQUACY: ABSENT
Diagnosis: NEGATIVE
HPV (WINDOPATH): NOT DETECTED

## 2018-11-19 ENCOUNTER — Ambulatory Visit (INDEPENDENT_AMBULATORY_CARE_PROVIDER_SITE_OTHER): Payer: 59 | Admitting: Adult Health

## 2018-11-19 ENCOUNTER — Encounter: Payer: Self-pay | Admitting: Adult Health

## 2018-11-19 VITALS — BP 140/82 | HR 65 | Ht 63.0 in | Wt 136.0 lb

## 2018-11-19 DIAGNOSIS — E782 Mixed hyperlipidemia: Secondary | ICD-10-CM | POA: Diagnosis not present

## 2018-11-19 DIAGNOSIS — R030 Elevated blood-pressure reading, without diagnosis of hypertension: Secondary | ICD-10-CM | POA: Diagnosis not present

## 2018-11-19 DIAGNOSIS — Z3041 Encounter for surveillance of contraceptive pills: Secondary | ICD-10-CM

## 2018-11-19 DIAGNOSIS — Z1329 Encounter for screening for other suspected endocrine disorder: Secondary | ICD-10-CM

## 2018-11-19 MED ORDER — NORETHIN-ETH ESTRAD-FE BIPHAS 1 MG-10 MCG / 10 MCG PO TABS: 1.0000 | ORAL_TABLET | Freq: Every day | ORAL | 0 refills | Status: DC

## 2018-11-19 NOTE — Progress Notes (Signed)
Patient ID: Betty Coleman, female   DOB: August 04, 1971, 48 y.o.   MRN: [id number] History of Present Illness: Betty Coleman is a 48 year old white female, married, G2P2 in for BP check and get fasting labs. BP has ranged from 117/76-150/109. PCP is Betty Coleman.   Current Medications, Allergies, Past Medical History, Past Surgical History, Family History and Social History were reviewed in [employer] record.     Review of Systems: Patient denies any headaches, hearing loss, fatigue, blurred vision, shortness of breath, chest pain, abdominal pain, problems with bowel movements, urination, or intercourse. No joint pain or mood swings.    Physical Exam:BP 140/82 (BP Location: Left Arm, Patient Position: Sitting, Cuff Size: Normal)   Pulse 65   Ht 5' 3"  (1.6 m)   Wt 136 lb (61.7 kg)   BMI 24.09 kg/m  General:  Well developed, well nourished, no acute distress Skin:  Warm and dry Lungs; Clear to auscultation bilaterally Cardiovascular: Regular rate and rhythm Psych:  No mood changes, alert and cooperative,seems happy Fall risk is low. PHQ 2 score 0. Finish current pack of OCs then start lo loestrin and use condoms.   Impression: 1. Elevated BP without diagnosis of hypertension   2. Encounter for surveillance of contraceptive pills   3. Elevated cholesterol with elevated triglycerides   4. Screening for thyroid disorder       Plan: Check CBC,CMP,TSH and lipids Given 3 packs of lo loestrin to start with next cycle, use condoms Get active,and keep eating healthy  Return in 3 months for BP check, if still elevated will go to [facility] and start BP meds Keep BP log Will talk when labs back

## 2018-11-20 LAB — COMPREHENSIVE METABOLIC PANEL
ALT: 13 IU/L (ref 0–32)
AST: 17 IU/L (ref 0–40)
Albumin/Globulin Ratio: 1.5 (ref 1.2–2.2)
Albumin: 4.4 g/dL (ref 3.8–4.8)
Alkaline Phosphatase: 52 IU/L (ref 39–117)
BUN/Creatinine Ratio: 10 (ref 9–23)
BUN: 9 mg/dL (ref 6–24)
Bilirubin Total: 0.2 mg/dL (ref 0.0–1.2)
CO2: 23 mmol/L (ref 20–29)
Calcium: 9.5 mg/dL (ref 8.7–10.2)
Chloride: 101 mmol/L (ref 96–106)
Creatinine, Ser: 0.86 mg/dL (ref 0.57–1.00)
GFR calc Af Amer: 94 mL/min/{1.73_m2} (ref >59)
GFR calc non Af Amer: 81 mL/min/{1.73_m2} (ref >59)
Globulin, Total: 3 g/dL (ref 1.5–4.5)
Glucose: 92 mg/dL (ref 65–99)
Potassium: 5.1 mmol/L (ref 3.5–5.2)
Sodium: 139 mmol/L (ref 134–144)
Total Protein: 7.4 g/dL (ref 6.0–8.5)

## 2018-11-20 LAB — LIPID PANEL
Chol/HDL Ratio: 3.2 ratio (ref 0.0–4.4)
Cholesterol, Total: 238 mg/dL — ABNORMAL HIGH (ref 100–199)
HDL: 75 mg/dL (ref >39)
LDL Calculated: 127 mg/dL — ABNORMAL HIGH (ref 0–99)
Triglycerides: 178 mg/dL — ABNORMAL HIGH (ref 0–149)
VLDL CHOLESTEROL CAL: 36 mg/dL (ref 5–40)

## 2018-11-20 LAB — CBC
HEMOGLOBIN: 14.4 g/dL (ref 11.1–15.9)
Hematocrit: 41.4 % (ref 34.0–46.6)
MCH: 30.9 pg (ref 26.6–33.0)
MCHC: 34.8 g/dL (ref 31.5–35.7)
MCV: 89 fL (ref 79–97)
Platelets: 372 10*3/uL (ref 150–450)
RBC: 4.66 x10E6/uL (ref 3.77–5.28)
RDW: 12.6 % (ref 11.7–15.4)
WBC: 6.8 10*3/uL (ref 3.4–10.8)

## 2018-11-20 LAB — TSH: TSH: 1.27 u[IU]/mL (ref 0.450–4.500)

## 2019-02-16 ENCOUNTER — Telehealth: Payer: Self-pay | Admitting: Adult Health

## 2019-02-16 NOTE — Telephone Encounter (Signed)
Pt requesting her Norethindrone-Ethinyl Estradiol-Fe Biphas (LO LOESTRIN FE) 1 MG-10 MCG / 10 MCG tablet  To be refilled and would like to see if her appt on Friday can be a tele visit.

## 2019-02-16 NOTE — Telephone Encounter (Signed)
Spoke with pt. Pt is requesting a refill on Lo Loestrin. She got samples and wants prescription sent to pharmacy. Also, pt has an appt Friday for BP check. Pt wonders if this can be a televisit. Pt does have a BP monitor at home.  Please advise. Thanks!! [place]

## 2019-02-17 ENCOUNTER — Other Ambulatory Visit: Payer: Self-pay | Admitting: Adult Health

## 2019-02-17 MED ORDER — NORETHIN-ETH ESTRAD-FE BIPHAS 1 MG-10 MCG / 10 MCG PO TABS: 1.0000 | ORAL_TABLET | Freq: Every day | ORAL | 4 refills | Status: DC

## 2019-02-17 NOTE — Telephone Encounter (Signed)
Left message letting pt know Lo Loestrin will be sent to pharmacy and Friday's visit has been changed to a televisit. [place]

## 2019-02-17 NOTE — Progress Notes (Signed)
Refilled lo loestrin

## 2019-02-18 ENCOUNTER — Encounter: Payer: Self-pay | Admitting: Adult Health

## 2019-02-18 ENCOUNTER — Ambulatory Visit (INDEPENDENT_AMBULATORY_CARE_PROVIDER_SITE_OTHER): Payer: 59 | Admitting: Adult Health

## 2019-02-18 ENCOUNTER — Other Ambulatory Visit: Payer: Self-pay

## 2019-02-18 VITALS — BP 146/96 | HR 69 | Ht 63.0 in

## 2019-02-18 DIAGNOSIS — R030 Elevated blood-pressure reading, without diagnosis of hypertension: Secondary | ICD-10-CM

## 2019-02-18 NOTE — Progress Notes (Signed)
Patient ID: Betty Coleman, female   DOB: 02-17-1971, 48 y.o.   MRN: [id number]   TELEHEALTH VIRTUAL GYNECOLOGY VISIT ENCOUNTER NOTE  I connected with Betty Coleman on 02/18/19 at  8:30 AM EDT by telephone at home and verified that I am speaking with the correct person using two identifiers.   I discussed the limitations, risks, security and privacy concerns of performing an evaluation and management service by telephone and the availability of in person appointments. I also discussed with the patient that there may be a patient responsible charge related to this service. The patient expressed understanding and agreed to proceed.   History:  Betty Coleman is a 48 y.o. G2P2002 white  Female,married, being evaluated today for BP. She says BP was better in April, ranging 132/82 to 125/81, but was eating better and going to gym then and now, since quarantined, not doing so good with eating habits and not exercising.  She denies any headaches, dizziness, swelling or other concerns.       Past Medical History:  Diagnosis Date  . Contraceptive management 11/29/2013  . Heart burn   . Internal hemorrhage 11/29/2013   Had + hemoccult will send 3 cards home    Past Surgical History:  Procedure Laterality Date  . CHOLECYSTECTOMY     The following portions of the patient's history were reviewed and updated as appropriate: allergies, current medications, past family history, past medical history, past social history, past surgical history and problem list.   Health Maintenance:  Normal pap and negative HRHPV on 10/13/18.  Normal mammogram on 10/14/18.  Review of Systems:  Pertinent items noted in HPI and remainder of comprehensive ROS otherwise negative.  Physical Exam:   General:  Alert, oriented and cooperative.   Mental Status: Normal mood and affect perceived. Normal judgment and thought content.  Physical exam deferred due to nature of the encounter BP (!) 146/96   Pulse 69   Ht 5' 3"   (1.6 m)   BMI 24.09 kg/m per pt.  Fall risk is moderate. She does not want to start BP meds yet, wants to try to watch diet and exercise first, but she is aware that in 4 weeks if no progress will need medication.   Labs and Imaging No results found for this or any previous visit (from the past 336 hour(s)). No results found.    Assessment and Plan:     1. Elevated BP without diagnosis of hypertension -try to decrease salt and sugar and processed food, and exercise -keep  Better BP log. F/U in 4 weeks for BP, can be tele visit       I discussed the assessment and treatment plan with the patient. The patient was provided an opportunity to ask questions and all were answered. The patient agreed with the plan and demonstrated an understanding of the instructions.   The patient was advised to call back or seek an in-person evaluation/go to the ED if the symptoms worsen or if the condition fails to improve as anticipated.  I provided 6  minutes of non-face-to-face time during this encounter.   Derrek Monaco, NP [facility], [place]

## 2019-02-21 ENCOUNTER — Telehealth: Payer: Self-pay | Admitting: Adult Health

## 2019-02-21 NOTE — Telephone Encounter (Signed)
Come get samples of lo loestrin and discount card

## 2019-02-21 NOTE — Telephone Encounter (Signed)
Patient called, stated that she had a telephone visit Friday with Anderson Malta.  Her bc pills were called in, but the insurance denied.  She would like to know what to do now.  [facility]  [phone]

## 2019-03-09 ENCOUNTER — Encounter: Payer: Self-pay | Admitting: *Deleted

## 2019-03-18 ENCOUNTER — Other Ambulatory Visit: Payer: Self-pay

## 2019-03-18 ENCOUNTER — Ambulatory Visit (INDEPENDENT_AMBULATORY_CARE_PROVIDER_SITE_OTHER): Payer: 59 | Admitting: Adult Health

## 2019-03-18 ENCOUNTER — Encounter: Payer: Self-pay | Admitting: Adult Health

## 2019-03-18 VITALS — BP 139/89 | HR 75 | Ht 63.0 in | Wt 133.0 lb

## 2019-03-18 DIAGNOSIS — R030 Elevated blood-pressure reading, without diagnosis of hypertension: Secondary | ICD-10-CM | POA: Diagnosis not present

## 2019-03-18 NOTE — Progress Notes (Addendum)
Patient ID: Betty Coleman, female   DOB: 24-Jul-1971, 48 y.o.   MRN: [id number]   TELEHEALTH VIRTUAL GYNECOLOGY VISIT ENCOUNTER NOTE  I connected with Betty Coleman on 03/18/19 at  9:00 AM EDT by telephone at home and verified that I am speaking with the correct person using two identifiers.   I discussed the limitations, risks, security and privacy concerns of performing an evaluation and management service by telephone and the availability of in person appointments. I also discussed with the patient that there may be a patient responsible charge related to this service. The patient expressed understanding and agreed to proceed.   History:  Betty Coleman is a 48 y.o. G48P2002 female being evaluated today for BP evaluation and she has been checking at home staying in 120/70 range, as low as 115/64.. She denies any headaches, dizziness or other concerns.   She has been watching carbs and salt and trying to be more active and has lost a few lbs.   PCP is [facility].    Past Medical History:  Diagnosis Date  . Contraceptive management 11/29/2013  . Heart burn   . Internal hemorrhage 11/29/2013   Had + hemoccult will send 3 cards home    Past Surgical History:  Procedure Laterality Date  . CHOLECYSTECTOMY     The following portions of the patient's history were reviewed and updated as appropriate: allergies, current medications, past family history, past medical history, past social history, past surgical history and problem list.   Health Maintenance:  Normal pap and negative HRHPV on 10/13/18.  Normal mammogram on 10/14/18.   Review of Systems:  Pertinent items noted in HPI and remainder of comprehensive ROS otherwise negative.  Physical Exam:   General:  Alert, oriented and cooperative.   Mental Status: Normal mood and affect perceived. Normal judgment and thought content.  Physical exam deferred due to nature of the encounter BP 139/89 (BP Location: Left Arm, Patient Position:  Sitting, Cuff Size: Normal)   Pulse 75   Ht 5' 3"  (1.6 m)   Wt 133 lb (60.3 kg)   BMI 23.56 kg/m per pt. Fall risk is moderate.  Labs and Imaging No results found for this or any previous visit (from the past 336 hour(s)). No results found.    Assessment and Plan:     1. Elevated BP without diagnosis of hypertension -Keep BP log,cll with any concerns Physical in January        I discussed the assessment and treatment plan with the patient. The patient was provided an opportunity to ask questions and all were answered. The patient agreed with the plan and demonstrated an understanding of the instructions.   The patient was advised to call back or seek an in-person evaluation/go to the ED if the symptoms worsen or if the condition fails to improve as anticipated.  I provided 5 minutes of non-face-to-face time during this encounter.   Derrek Monaco, NP [facility], [place]

## 2019-06-29 ENCOUNTER — Other Ambulatory Visit: Payer: Self-pay

## 2019-06-29 DIAGNOSIS — Z20822 Contact with and (suspected) exposure to covid-19: Secondary | ICD-10-CM

## 2019-07-01 LAB — NOVEL CORONAVIRUS, NAA: SARS-CoV-2, NAA: NOT DETECTED

## 2019-10-10 ENCOUNTER — Telehealth: Payer: Self-pay | Admitting: *Deleted

## 2019-10-10 NOTE — Telephone Encounter (Signed)
Patient left message for Anderson Malta that she would like a generic birth control sent in for her that is like LoLoestrin.

## 2019-10-10 NOTE — Telephone Encounter (Signed)
Will give sample of lo lo til appt,

## 2019-11-25 ENCOUNTER — Ambulatory Visit (INDEPENDENT_AMBULATORY_CARE_PROVIDER_SITE_OTHER): Payer: 59 | Admitting: Adult Health

## 2019-11-25 ENCOUNTER — Encounter: Payer: Self-pay | Admitting: Adult Health

## 2019-11-25 ENCOUNTER — Other Ambulatory Visit: Payer: Self-pay

## 2019-11-25 VITALS — BP 134/75 | HR 73 | Ht 62.0 in | Wt 131.0 lb

## 2019-11-25 DIAGNOSIS — Z01419 Encounter for gynecological examination (general) (routine) without abnormal findings: Secondary | ICD-10-CM | POA: Diagnosis not present

## 2019-11-25 DIAGNOSIS — E782 Mixed hyperlipidemia: Secondary | ICD-10-CM

## 2019-11-25 DIAGNOSIS — Z1211 Encounter for screening for malignant neoplasm of colon: Secondary | ICD-10-CM

## 2019-11-25 DIAGNOSIS — Z1212 Encounter for screening for malignant neoplasm of rectum: Secondary | ICD-10-CM | POA: Diagnosis not present

## 2019-11-25 DIAGNOSIS — Z3041 Encounter for surveillance of contraceptive pills: Secondary | ICD-10-CM

## 2019-11-25 LAB — HEMOCCULT GUIAC POC 1CARD (OFFICE): Fecal Occult Blood, POC: NEGATIVE

## 2019-11-25 MED ORDER — LO LOESTRIN FE 1 MG-10 MCG / 10 MCG PO TABS: 1.0000 | ORAL_TABLET | Freq: Every day | ORAL | 4 refills | Status: DC

## 2019-11-25 NOTE — Progress Notes (Signed)
Patient ID: Betty Coleman, female   DOB: [birth year], 49 y.o.   MRN: [id number] History of Present Illness: Betty Coleman is a 49 year old white female, married, G2P2 in for a well woman gyn exam, she had a normal pap with negative HPV 10/13/18. PCP is Betty Coleman   Current Medications, Allergies, Past Medical History, Past Surgical History, Family History and Social History were reviewed in [employer] record.     Review of Systems: Patient denies any  hearing loss, fatigue, blurred vision, shortness of breath, chest pain, abdominal pain, problems with bowel movements, urination, or intercourse. No joint pain or mood swings. Has occasional headaches. BP good at hone 127/76 Happy with COs, no periods    Physical Exam:BP 134/75 (BP Location: Left Arm, Patient Position: Sitting, Cuff Size: Normal)   Pulse 73   Ht 5' 2"  (1.575 m)   Wt 131 lb (59.4 kg)   BMI 23.96 kg/m  General:  Well developed, well nourished, no acute distress Skin:  Warm and dry Neck:  Midline trachea, normal thyroid, good ROM, no lymphadenopathy Lungs; Clear to auscultation bilaterally Breast:  No dominant palpable mass, retraction, or nipple discharge Cardiovascular: Regular rate and rhythm Abdomen:  Soft, non tender, no hepatosplenomegaly Pelvic:  External genitalia is normal in appearance, no lesions.  The vagina is normal in appearance. Urethra has no lesions or masses. The cervix is bulbous, and everted at the os.  Uterus is felt to be normal size, shape, and contour.  No adnexal masses or tenderness noted.Bladder is non tender, no masses felt. Rectal: Good sphincter tone, no polyps, or hemorrhoids felt.  Hemoccult negative. Extremities/musculoskeletal:  No swelling or varicosities noted, no clubbing or cyanosis Psych:  No mood changes, alert and cooperative,seems happy Fall risk is low PHQ 2 score is 1. Co exam with with Terri Skains NP student.  Impression and Plan:  1. Encounter for  well woman exam with routine gynecological exam Physical in 1 year Pap in 2023  2. Encounter for surveillance of contraceptive pills Meds ordered this encounter  Medications  . Norethindrone-Ethinyl Estradiol-Fe Biphas (LO LOESTRIN FE) 1 MG-10 MCG / 10 MCG tablet    Sig: Take 1 tablet by mouth daily. Take 1 daily by mouth    Dispense:  3 Package    Refill:  4    BIN [id number], PCN [id number], GRP [id number]    Order Specific Question:   Supervising Provider    Answer:   Tania Ade H [[id number]]    3. Screening for colorectal cancer Colonoscopy or colo guard at 50  4. Elevated cholesterol with elevated triglycerides Check lipids and CMP

## 2019-11-26 LAB — COMPREHENSIVE METABOLIC PANEL
ALT: 12 IU/L (ref 0–32)
AST: 16 IU/L (ref 0–40)
Albumin/Globulin Ratio: 1.6 (ref 1.2–2.2)
Albumin: 4.2 g/dL (ref 3.8–4.8)
Alkaline Phosphatase: 52 IU/L (ref 39–117)
BUN/Creatinine Ratio: 13 (ref 9–23)
BUN: 10 mg/dL (ref 6–24)
Bilirubin Total: 0.4 mg/dL (ref 0.0–1.2)
CO2: 24 mmol/L (ref 20–29)
Calcium: 9.2 mg/dL (ref 8.7–10.2)
Chloride: 102 mmol/L (ref 96–106)
Creatinine, Ser: 0.78 mg/dL (ref 0.57–1.00)
GFR calc Af Amer: 105 mL/min/{1.73_m2} (ref >59)
GFR calc non Af Amer: 91 mL/min/{1.73_m2} (ref >59)
Globulin, Total: 2.7 g/dL (ref 1.5–4.5)
Glucose: 86 mg/dL (ref 65–99)
Potassium: 5.1 mmol/L (ref 3.5–5.2)
Sodium: 141 mmol/L (ref 134–144)
Total Protein: 6.9 g/dL (ref 6.0–8.5)

## 2019-11-26 LAB — LIPID PANEL
Chol/HDL Ratio: 2.7 ratio (ref 0.0–4.4)
Cholesterol, Total: 185 mg/dL (ref 100–199)
HDL: 68 mg/dL (ref >39)
LDL Chol Calc (NIH): 102 mg/dL — ABNORMAL HIGH (ref 0–99)
Triglycerides: 80 mg/dL (ref 0–149)
VLDL Cholesterol Cal: 15 mg/dL (ref 5–40)

## 2020-01-10 ENCOUNTER — Telehealth: Payer: Self-pay | Admitting: Adult Health

## 2020-01-10 NOTE — Telephone Encounter (Signed)
Come get samples for now

## 2020-01-10 NOTE — Telephone Encounter (Signed)
Patient called stating that Anderson Malta has perscribed her a BC that her insurance did not pay for and it is costing her $100 and there is no generic for it. Pt would like to know if there is another [facility] that Anderson Malta could call in. Please contact pt

## 2020-01-15 IMAGING — MG DIGITAL SCREENING BILATERAL MAMMOGRAM WITH TOMO AND CAD
8 series · 9 of 24 positions shown · non-contrast
Comparison: Previous exam(s).

CLINICAL DATA: Screening.

EXAM:
DIGITAL SCREENING BILATERAL MAMMOGRAM WITH TOMO AND CAD

[R MLO synth-2D]
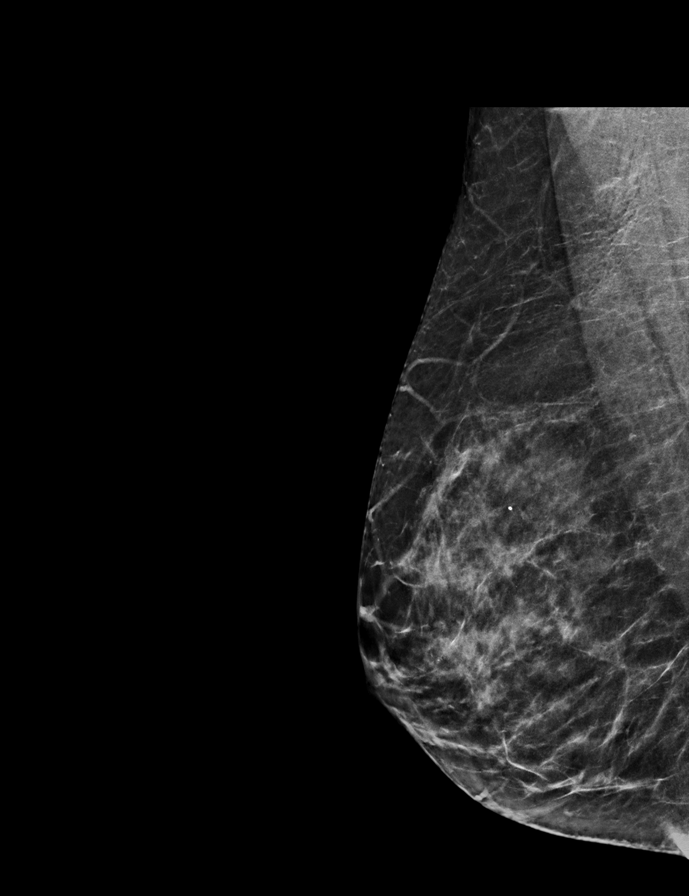

[L CC synth-2D]
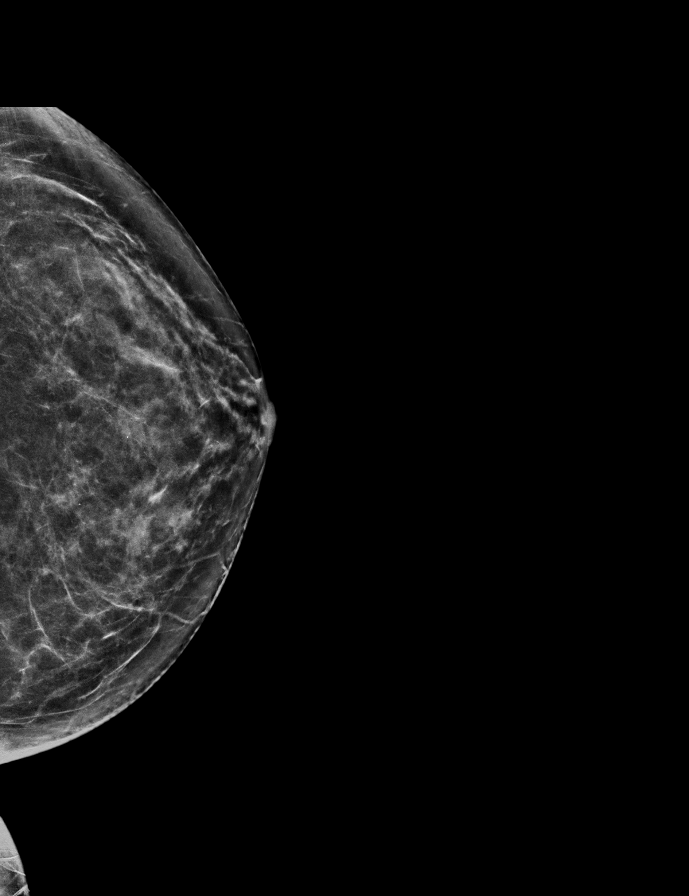

[L MLO synth-2D]
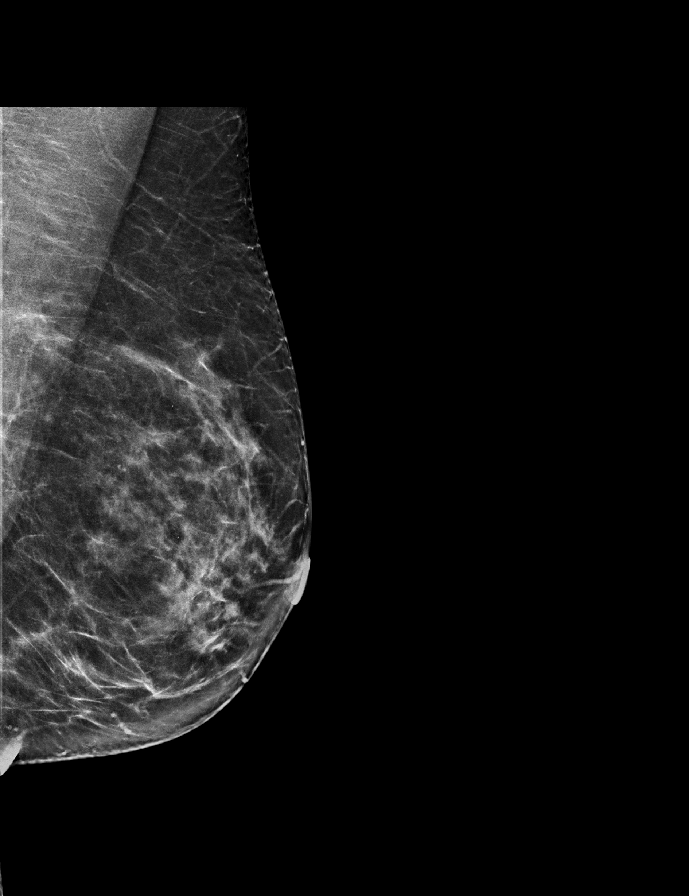

[R CC synth-2D]
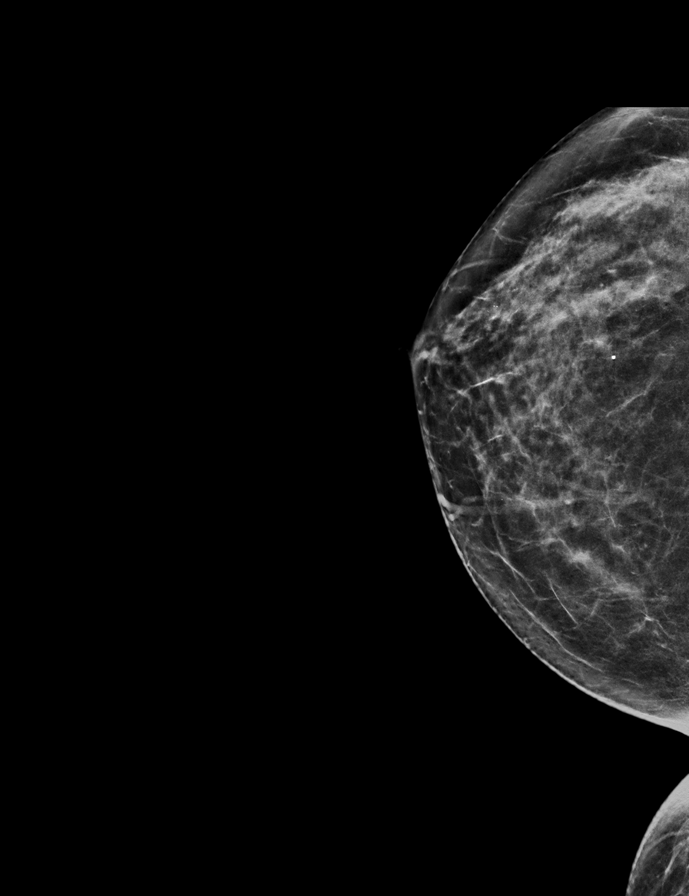

[L CC tomo · 2 of 66 frames shown]
[frame 22/66]
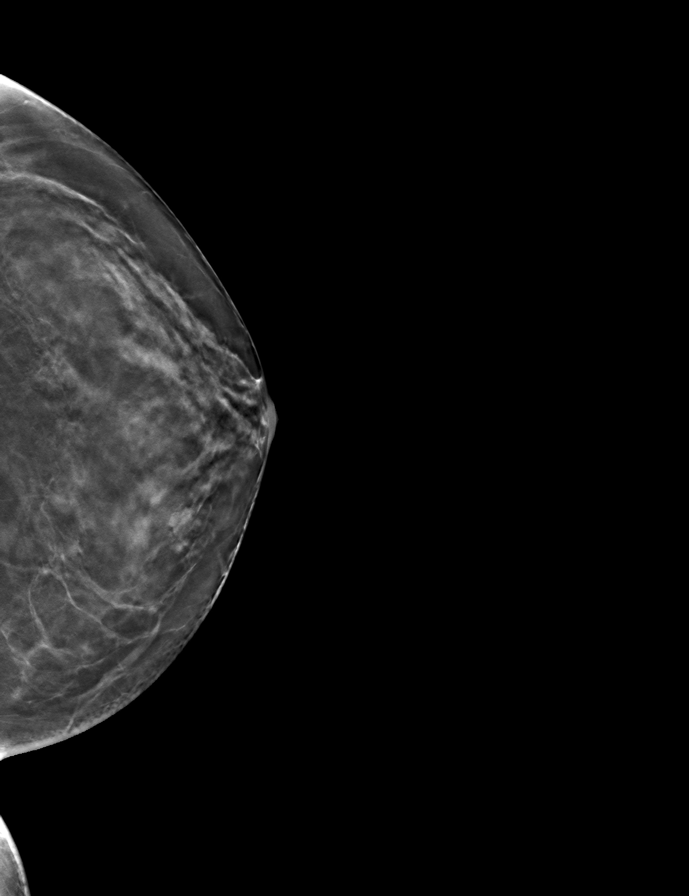
[frame 33/66]
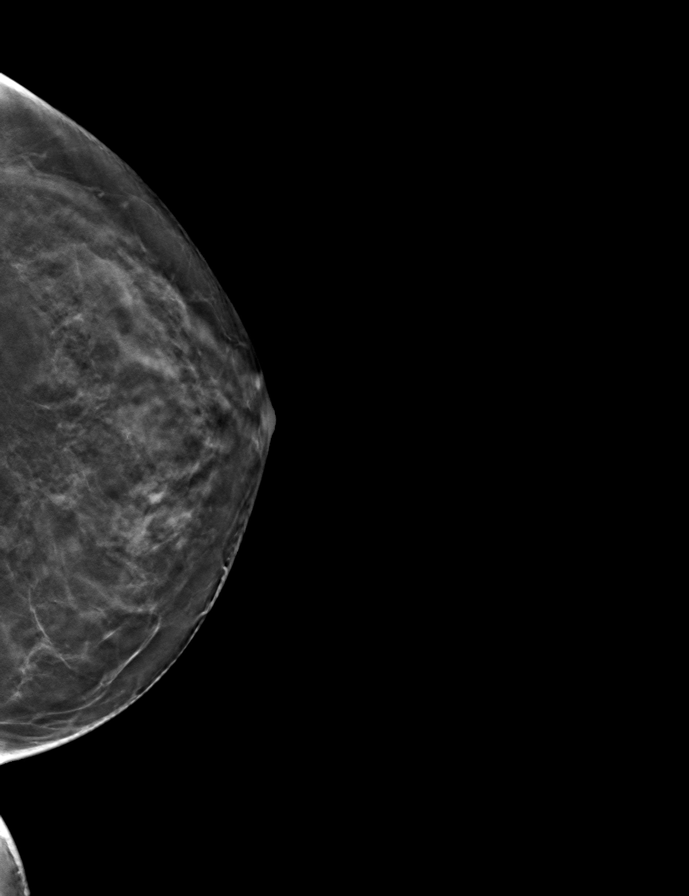

[R MLO tomo · tomo slice 33/65.0]
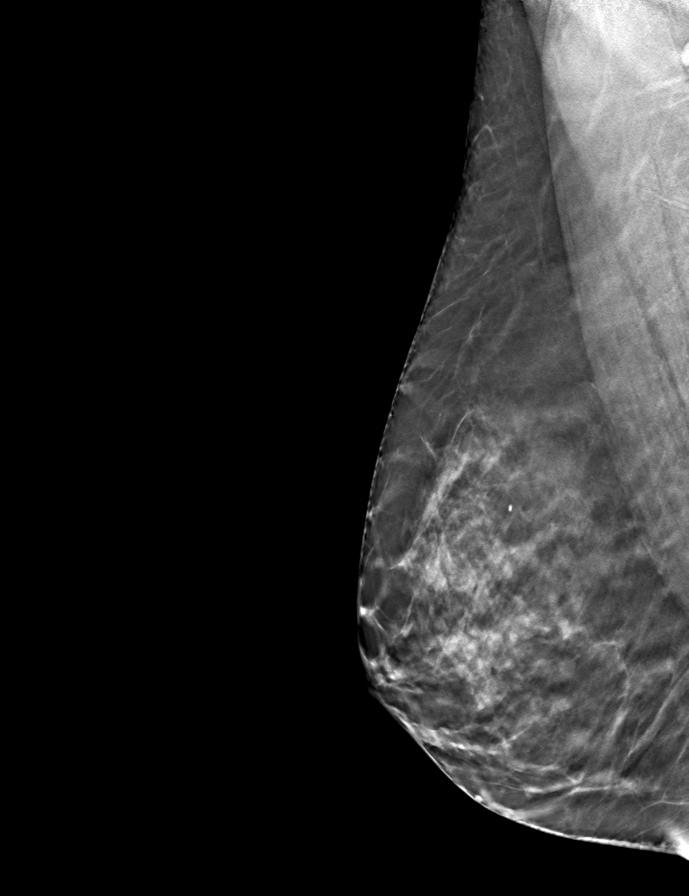

[R CC tomo · tomo slice 33/64.0]
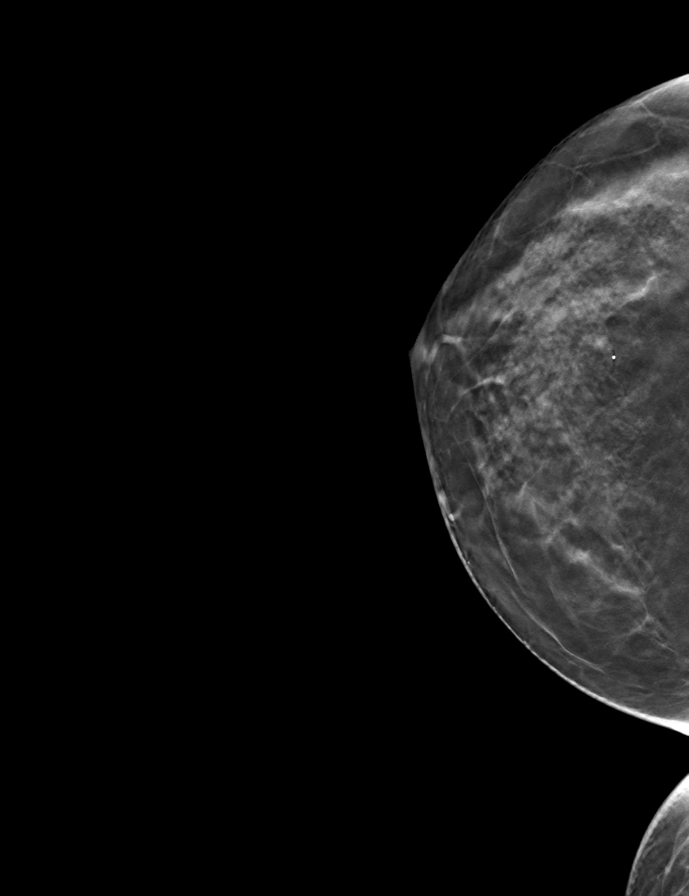

[L MLO tomo · tomo slice 35/68.0]
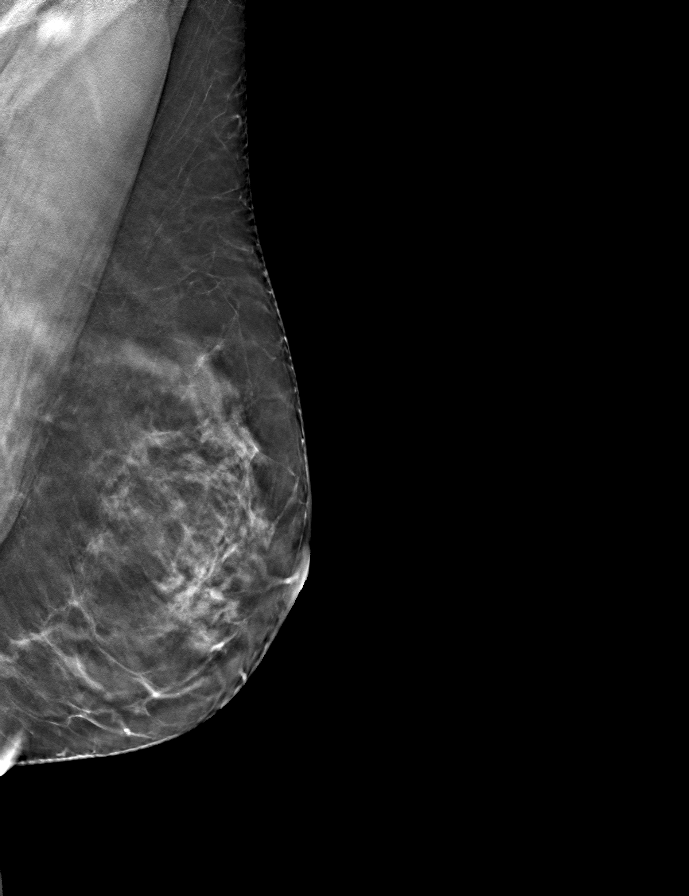

[9 of 24 positions shown; findings below may reference images not displayed]

ACR Breast Density Category c: The breast tissue is heterogeneously
dense, which may obscure small masses.
FINDINGS: There are no findings suspicious for malignancy. Images were
processed with CAD.
IMPRESSION: No mammographic evidence of malignancy. A result letter of this
screening mammogram will be mailed directly to the patient.

RECOMMENDATION:
Screening mammogram in one year. (Code:FT-[id number])

BI-RADS CATEGORY  1: Negative.

## 2020-01-23 ENCOUNTER — Ambulatory Visit (INDEPENDENT_AMBULATORY_CARE_PROVIDER_SITE_OTHER): Payer: 59 | Admitting: Family Medicine

## 2020-01-23 ENCOUNTER — Other Ambulatory Visit: Payer: Self-pay

## 2020-01-23 VITALS — BP 118/72 | Temp 97.9°F | Wt 132.6 lb

## 2020-01-23 DIAGNOSIS — S61431A Puncture wound without foreign body of right hand, initial encounter: Secondary | ICD-10-CM | POA: Diagnosis not present

## 2020-01-23 DIAGNOSIS — M79641 Pain in right hand: Secondary | ICD-10-CM

## 2020-01-23 DIAGNOSIS — W5501XA Bitten by cat, initial encounter: Secondary | ICD-10-CM | POA: Diagnosis not present

## 2020-01-23 DIAGNOSIS — Z23 Encounter for immunization: Secondary | ICD-10-CM

## 2020-01-23 MED ORDER — AMOXICILLIN-POT CLAVULANATE 875-125 MG PO TABS: 1.0000 | ORAL_TABLET | Freq: Two times a day (BID) | ORAL | 0 refills | Status: AC

## 2020-01-23 NOTE — Progress Notes (Signed)
   Subjective:    Patient ID: Betty Coleman, female    DOB: 10/02/1970, 49 y.o.   MRN: [id number]  HPI  Patient arrives with cat bite to right hand since yesterday pm- it was her friend's cat and UTD on shots.  Patient's last tetanus shot was 7 years ago  Patient was bitten several times on the hand  Occurred yesterday  Cat has had all of its shots  Patient noted substantial swelling and discomfort.  No discharge.  No fever or chills Review of Systems See above    Objective:   Physical Exam  Alert vitals stable, NAD. Blood pressure good on repeat. HEENT normal. Lungs clear. Heart regular rate and rhythm. Dorsum of the involved hand reveals multiple puncture wounds with evident swelling and tenderness slight erythema      Assessment & Plan:  Impression cat bite with evidence of secondary infection.  Augmentin twice daily for 10 days.  Tetanus shot.  Warning signs discussed

## 2020-02-10 ENCOUNTER — Other Ambulatory Visit (HOSPITAL_COMMUNITY): Payer: Self-pay | Admitting: Adult Health

## 2020-02-10 DIAGNOSIS — Z1231 Encounter for screening mammogram for malignant neoplasm of breast: Secondary | ICD-10-CM

## 2020-02-17 ENCOUNTER — Ambulatory Visit (HOSPITAL_COMMUNITY)
Admission: RE | Admit: 2020-02-17 | Discharge: 2020-02-17 | Disposition: A | Discharge status: Home / Self Care | Payer: 59 | Source: Ambulatory Visit | Attending: Adult Health | Admitting: Adult Health

## 2020-02-17 ENCOUNTER — Other Ambulatory Visit: Payer: Self-pay

## 2020-02-17 DIAGNOSIS — Z1231 Encounter for screening mammogram for malignant neoplasm of breast: Secondary | ICD-10-CM | POA: Diagnosis not present

## 2020-06-05 ENCOUNTER — Telehealth: Payer: Self-pay | Admitting: Adult Health

## 2020-06-05 NOTE — Telephone Encounter (Signed)
Will give samples of lo loestrin

## 2020-06-05 NOTE — Telephone Encounter (Signed)
Would like for Anderson Malta to call her in Ref to her Lo estrin birth control

## 2020-07-06 ENCOUNTER — Encounter: Payer: Self-pay | Admitting: Family Medicine

## 2020-07-06 ENCOUNTER — Telehealth (INDEPENDENT_AMBULATORY_CARE_PROVIDER_SITE_OTHER): Payer: 59 | Admitting: Family Medicine

## 2020-07-06 DIAGNOSIS — J0110 Acute frontal sinusitis, unspecified: Secondary | ICD-10-CM

## 2020-07-06 DIAGNOSIS — U071 COVID-19: Secondary | ICD-10-CM | POA: Diagnosis not present

## 2020-07-06 DIAGNOSIS — R519 Headache, unspecified: Secondary | ICD-10-CM | POA: Diagnosis not present

## 2020-07-06 MED ORDER — FLUTICASONE PROPIONATE 50 MCG/ACT NA SUSP: 2.0000 | Freq: Every day | NASAL | 1 refills | Status: DC

## 2020-07-06 MED ORDER — AMOXICILLIN-POT CLAVULANATE 875-125 MG PO TABS: 1.0000 | ORAL_TABLET | Freq: Two times a day (BID) | ORAL | 0 refills | Status: DC

## 2020-07-06 NOTE — Progress Notes (Signed)
Patient ID: Betty Coleman, female    DOB: 1971-08-10, 49 y.o.   MRN: [id number]  Virtual Visit via Telephone Note  I connected with Betty Coleman on 07/06/20 at  4:10 PM EDT by telephone and verified that I am speaking with the correct person using two identifiers.  Location: Patient: home Provider:  office   I discussed the limitations, risks, security and privacy concerns of performing an evaluation and management service by telephone and the availability of in person appointments. I also discussed with the patient that there may be a patient responsible charge related to this service. The patient expressed understanding and agreed to proceed.  Chief Complaint  Patient presents with  . Covid Positive    patient on day 9 of covid and now having return of fever and head pressure and congestion and thinks it has went into sinus infection   Subjective:    HPI Pt having covid symptoms for past 9 days, tested positive for covid 3 days ago. Having fever in beginning, went away then yesterday started back up again, around 100F. Having head pressure, mild intermittent cough. Having some nasal discomfort, but not flowing nose or nasal discharge. Taking tylenol/ibuprofen and sudafed. No sob, sore throat or ear pain.  Medical History Betty Coleman has a past medical history of Contraceptive management (11/29/2013), Heart burn, and Internal hemorrhage (11/29/2013).   Outpatient Encounter Medications as of 07/06/2020  Medication Sig  . amoxicillin-clavulanate (AUGMENTIN) 875-125 MG tablet Take 1 tablet by mouth 2 (two) times daily.  . fluticasone (FLONASE) 50 MCG/ACT nasal spray Place 2 sprays into both nostrils daily.  Marland Kitchen ibuprofen (ADVIL,MOTRIN) 200 MG tablet Take 200-400 mg by mouth as needed.  . Norethindrone-Ethinyl Estradiol-Fe Biphas (LO LOESTRIN FE) 1 MG-10 MCG / 10 MCG tablet Take 1 tablet by mouth daily. Take 1 daily by mouth  . Probiotic Product (PROBIOTIC PO) Take by mouth daily.  .  [DISCONTINUED] amoxicillin-clavulanate (AUGMENTIN) 875-125 MG tablet Take 1 tablet by mouth 2 (two) times daily.  . [DISCONTINUED] fluticasone (FLONASE) 50 MCG/ACT nasal spray Place 2 sprays into both nostrils daily.   No facility-administered encounter medications on file as of 07/06/2020.     Review of Systems  Constitutional: Positive for fever (yesterday). Negative for chills.  HENT: Positive for congestion and sinus pain. Negative for ear pain, rhinorrhea, sinus pressure and sore throat.   Eyes: Negative for pain, discharge and itching.  Respiratory: Negative for cough.   Gastrointestinal: Negative for constipation, diarrhea, nausea and vomiting.  Neurological: Positive for headaches.     Vitals There were no vitals taken for this visit.  Objective:   Physical Exam  No PE, virtual phone visit.  Assessment and Plan   1. Acute non-recurrent frontal sinusitis - amoxicillin-clavulanate (AUGMENTIN) 875-125 MG tablet; Take 1 tablet by mouth 2 (two) times daily.  Dispense: 20 tablet; Refill: 0 - fluticasone (FLONASE) 50 MCG/ACT nasal spray; Place 2 sprays into both nostrils daily.  Dispense: 16 g; Refill: 1  2. Nonintractable headache, unspecified chronicity pattern, unspecified headache type  3. COVID-19   Pt has had covid symptoms for 9 days, and tested positive 3 days ago. Pt feeling this has started up a sinus infection. Will treat with augmentin, flonase and sudafed prn.  Tylenol/ibuprofen prn. Continue to quarantine for 10 days from onset of symptoms or if still having fever.  Call in 3-5 days if not improving or worsening.  Pt in agreement.  F/u prn.    Follow Up Instructions:  I discussed the assessment and treatment plan with the patient. The patient was provided an opportunity to ask questions and all were answered. The patient agreed with the plan and demonstrated an understanding of the instructions.   The patient was advised to call back or seek an  in-person evaluation if the symptoms worsen or if the condition fails to improve as anticipated.  I provided 15 minutes of non-face-to-face time during this encounter.

## 2020-08-28 ENCOUNTER — Telehealth: Payer: Self-pay | Admitting: Adult Health

## 2020-08-28 NOTE — Telephone Encounter (Signed)
Lo Loestrin is now not covered by her insurance.  Wanting to stay on this medication if possible. She wanting to know what options she has.

## 2020-08-29 NOTE — Telephone Encounter (Signed)
Pt gave 6  samples lo loestrin

## 2021-01-08 ENCOUNTER — Encounter: Payer: Self-pay | Admitting: Adult Health

## 2021-01-08 ENCOUNTER — Ambulatory Visit (INDEPENDENT_AMBULATORY_CARE_PROVIDER_SITE_OTHER): Payer: 59 | Admitting: Adult Health

## 2021-01-08 ENCOUNTER — Other Ambulatory Visit: Payer: Self-pay

## 2021-01-08 VITALS — BP 136/85 | HR 71 | Ht 63.0 in | Wt 139.2 lb

## 2021-01-08 DIAGNOSIS — Z3041 Encounter for surveillance of contraceptive pills: Secondary | ICD-10-CM

## 2021-01-08 DIAGNOSIS — Z1211 Encounter for screening for malignant neoplasm of colon: Secondary | ICD-10-CM | POA: Insufficient documentation

## 2021-01-08 DIAGNOSIS — Z01419 Encounter for gynecological examination (general) (routine) without abnormal findings: Secondary | ICD-10-CM | POA: Diagnosis not present

## 2021-01-08 DIAGNOSIS — Z131 Encounter for screening for diabetes mellitus: Secondary | ICD-10-CM | POA: Diagnosis not present

## 2021-01-08 DIAGNOSIS — Z1231 Encounter for screening mammogram for malignant neoplasm of breast: Secondary | ICD-10-CM

## 2021-01-08 DIAGNOSIS — Z13220 Encounter for screening for lipoid disorders: Secondary | ICD-10-CM

## 2021-01-08 LAB — HEMOCCULT GUIAC POC 1CARD (OFFICE): Fecal Occult Blood, POC: NEGATIVE

## 2021-01-08 NOTE — Progress Notes (Signed)
Patient ID: Betty Coleman, female   DOB: 1971/08/28, 50 y.o.   MRN: [id number] History of Present Illness: Betty Coleman is a 50 year old white female,married, G2P2, in for well woman gyn exam, she had a normal pap with negative HPV 10/13/2018. She had COVID and smell is still off, says poop smells metallic as do eggs. PCP is Sallee Lange MD.  Current Medications, Allergies, Past Medical History, Past Surgical History, Family History and Social History were reviewed in [employer] [facility] record.     Review of Systems: Patient denies any headaches, hearing loss, fatigue, blurred vision, shortness of breath, chest pain, abdominal pain, problems with bowel movements, urination, or intercourse. No joint pain or mood swings. Smell is off. No periods with OCs.  Physical Exam:BP 136/85 (BP Location: Right Arm, Patient Position: Sitting, Cuff Size: Normal)   Pulse 71   Ht 5' 3"  (1.6 m)   Wt 139 lb 3.2 oz (63.1 kg)   LMP  (LMP Unknown)   BMI 24.66 kg/m  General:  Well developed, well nourished, no acute distress Skin:  Warm and dry Neck:  Midline trachea, normal thyroid, good ROM, no lymphadenopathy Lungs; Clear to auscultation bilaterally Breast:  No dominant palpable mass, retraction, or nipple discharge Cardiovascular: Regular rate and rhythm Abdomen:  Soft, non tender, no hepatosplenomegaly Pelvic:  External genitalia is normal in appearance, no lesions.  The vagina is normal in appearance. Urethra has no lesions or masses. The cervix is bulbous.  Uterus is felt to be normal size, shape, and contour.  No adnexal masses or tenderness noted.Bladder is non tender, no masses felt. Rectal: Good sphincter tone, no polyps, or hemorrhoids felt.  Hemoccult negative. Extremities/musculoskeletal:  No swelling or varicosities noted, no clubbing or cyanosis Psych:  No mood changes, alert and cooperative,seems happy AA is 5 Fall risk is low PHQ 9 score is 2 GAD score is 5  [facility] - 01/08/21  1016      Pregnancy Intention Screening   Does the patient want to become pregnant in the next year? No    Does the patient's partner want to become pregnant in the next year? No    Would the patient like to discuss contraceptive options today? Yes      Contraception Wrap Up   Current Method Oral Contraceptive    End Method Oral Contraceptive    Contraception Counseling Provided Yes         Examination chaperoned by Glenard Haring RN  Impression and Plan: 1. Encounter for well woman exam with routine gynecological exam Physical in 1 year with pap - CBC - Comprehensive metabolic panel - TSH - Lipid panel Colonoscopy by 50, advised   2. Encounter for screening fecal occult blood testing  - POCT occult blood stool  3. Encounter for surveillance of contraceptive pills I gave her 6 sample packs Lo Loestrin, her insurance not covering now  4. Screening for diabetes mellitus - Hemoglobin A1c  5. Screening cholesterol level - Lipid panel  6. Screening mammogram for breast cancer Call for mammogram after May 28th  - MM 3D SCREEN BREAST BILATERAL; Future

## 2021-01-09 LAB — COMPREHENSIVE METABOLIC PANEL
ALT: 18 IU/L (ref 0–32)
AST: 18 IU/L (ref 0–40)
Albumin/Globulin Ratio: 1.3 (ref 1.2–2.2)
Albumin: 4.3 g/dL (ref 3.8–4.8)
Alkaline Phosphatase: 54 IU/L (ref 44–121)
BUN/Creatinine Ratio: 13 (ref 9–23)
BUN: 12 mg/dL (ref 6–24)
Bilirubin Total: 0.3 mg/dL (ref 0.0–1.2)
CO2: 22 mmol/L (ref 20–29)
Calcium: 9.5 mg/dL (ref 8.7–10.2)
Chloride: 101 mmol/L (ref 96–106)
Creatinine, Ser: 0.93 mg/dL (ref 0.57–1.00)
Globulin, Total: 3.2 g/dL (ref 1.5–4.5)
Glucose: 91 mg/dL (ref 65–99)
Potassium: 5.1 mmol/L (ref 3.5–5.2)
Sodium: 141 mmol/L (ref 134–144)
Total Protein: 7.5 g/dL (ref 6.0–8.5)
eGFR: 76 mL/min/{1.73_m2} (ref >59)

## 2021-01-09 LAB — HEMOGLOBIN A1C
Est. average glucose Bld gHb Est-mCnc: 114 mg/dL
Hgb A1c MFr Bld: 5.6 % (ref 4.8–5.6)

## 2021-01-09 LAB — CBC
Hematocrit: 43.8 % (ref 34.0–46.6)
Hemoglobin: 14.8 g/dL (ref 11.1–15.9)
MCH: 31.1 pg (ref 26.6–33.0)
MCHC: 33.8 g/dL (ref 31.5–35.7)
MCV: 92 fL (ref 79–97)
Platelets: 348 10*3/uL (ref 150–450)
RBC: 4.76 x10E6/uL (ref 3.77–5.28)
RDW: 12.1 % (ref 11.7–15.4)
WBC: 8.2 10*3/uL (ref 3.4–10.8)

## 2021-01-09 LAB — TSH: TSH: 1.41 u[IU]/mL (ref 0.450–4.500)

## 2021-01-09 LAB — LIPID PANEL
Chol/HDL Ratio: 3.5 ratio (ref 0.0–4.4)
Cholesterol, Total: 259 mg/dL — ABNORMAL HIGH (ref 100–199)
HDL: 73 mg/dL (ref >39)
LDL Chol Calc (NIH): 165 mg/dL — ABNORMAL HIGH (ref 0–99)
Triglycerides: 121 mg/dL (ref 0–149)
VLDL Cholesterol Cal: 21 mg/dL (ref 5–40)

## 2021-04-24 ENCOUNTER — Ambulatory Visit (HOSPITAL_COMMUNITY)
Admission: RE | Admit: 2021-04-24 | Discharge: 2021-04-24 | Disposition: A | Discharge status: Home / Self Care | Payer: 59 | Source: Ambulatory Visit | Attending: Adult Health | Admitting: Adult Health

## 2021-04-24 ENCOUNTER — Other Ambulatory Visit: Payer: Self-pay

## 2021-04-24 DIAGNOSIS — Z1231 Encounter for screening mammogram for malignant neoplasm of breast: Secondary | ICD-10-CM | POA: Diagnosis present

## 2021-07-31 ENCOUNTER — Telehealth: Payer: Self-pay | Admitting: Adult Health

## 2021-07-31 NOTE — Telephone Encounter (Signed)
Patinet calling about samples for her birth  control for lo loestrin  her insurance doesn't cover it

## 2021-07-31 NOTE — Telephone Encounter (Signed)
Lo Loestrin samples given to pt. 4 boxes. Lot # [id number]. Exp 4/23. Pt to pick up at front desk. [place]

## 2021-11-21 ENCOUNTER — Telehealth: Payer: Self-pay | Admitting: Adult Health

## 2021-11-21 NOTE — Telephone Encounter (Signed)
Patient wants to see if she can get some samples of her birth control (LOESTRIN FE) to last her til her appointment in April. Please advise.  ?

## 2021-11-21 NOTE — Telephone Encounter (Signed)
Pt aware to come by and pick up 2 sample boxes of Lo Loestrin. Lot # [id number] A exp 11/23. JSY ?

## 2022-01-09 ENCOUNTER — Other Ambulatory Visit: Payer: 59 | Admitting: Adult Health

## 2022-01-10 ENCOUNTER — Other Ambulatory Visit (HOSPITAL_COMMUNITY)
Admission: RE | Admit: 2022-01-10 | Discharge: 2022-01-10 | Disposition: A | Discharge status: Home / Self Care | Payer: 59 | Source: Ambulatory Visit | Attending: Adult Health | Admitting: Adult Health

## 2022-01-10 ENCOUNTER — Ambulatory Visit (INDEPENDENT_AMBULATORY_CARE_PROVIDER_SITE_OTHER): Payer: 59 | Admitting: Adult Health

## 2022-01-10 ENCOUNTER — Encounter: Payer: Self-pay | Admitting: Adult Health

## 2022-01-10 VITALS — BP 134/86 | HR 78 | Ht 63.0 in | Wt 143.0 lb

## 2022-01-10 DIAGNOSIS — Z131 Encounter for screening for diabetes mellitus: Secondary | ICD-10-CM

## 2022-01-10 DIAGNOSIS — Z01419 Encounter for gynecological examination (general) (routine) without abnormal findings: Secondary | ICD-10-CM | POA: Diagnosis present

## 2022-01-10 DIAGNOSIS — Z1211 Encounter for screening for malignant neoplasm of colon: Secondary | ICD-10-CM

## 2022-01-10 DIAGNOSIS — E7800 Pure hypercholesterolemia, unspecified: Secondary | ICD-10-CM

## 2022-01-10 DIAGNOSIS — Z1212 Encounter for screening for malignant neoplasm of rectum: Secondary | ICD-10-CM

## 2022-01-10 LAB — HEMOCCULT GUIAC POC 1CARD (OFFICE): Fecal Occult Blood, POC: NEGATIVE

## 2022-01-10 NOTE — Progress Notes (Signed)
Patient ID: Betty Coleman, female   DOB: 07/31/1971, 51 y.o.   MRN: [id number] ?History of Present Illness: ?Betty Coleman is a 51 year old white female, married, G2P 2 in for a well woman gyn exam and pap. ?She is still working. Just got back from seeing son in [place].  ?PCP is Dr Sallee Lange. ? ? ?Current Medications, Allergies, Past Medical History, Past Surgical History, Family History and Social History were reviewed in [employer] record.   ? ? ?Review of Systems: ?Patient denies any headaches, hearing loss, fatigue, blurred vision, shortness of breath, chest pain, abdominal pain, problems with bowel movements, urination, or intercourse. No joint pain or mood swings.  ?No periods on lo loestrin, but would like to stop for a while, to see how she does. ? ? ?Physical Exam:BP 134/86 (BP Location: Left Arm, Patient Position: Sitting, Cuff Size: Normal)   Pulse 78   Ht 5' 3"  (1.6 m)   Wt 143 lb (64.9 kg)   LMP  (LMP Unknown)   BMI 25.33 kg/m?   ?General:  Well developed, well nourished, no acute distress ?Skin:  Warm and dry ?Neck:  Midline trachea, normal thyroid, good ROM, no lymphadenopathy ?Lungs; Clear to auscultation bilaterally ?Breast:  No dominant palpable mass, retraction, or nipple discharge ?Cardiovascular: Regular rate and rhythm ?Abdomen:  Soft, non tender, no hepatosplenomegaly ?Pelvic:  External genitalia is normal in appearance, no lesions.  The vagina is normal in appearance. Urethra has no lesions or masses. The cervix is smooth,pap with HR HPV genotyping performed.  Uterus is felt to be normal size, shape, and contour.  No adnexal masses or tenderness noted.Bladder is non tender, no masses felt. ?Rectal: Good sphincter tone, no polyps, or hemorrhoids felt.  Hemoccult negative. ?Extremities/musculoskeletal:  No swelling or varicosities noted, no clubbing or cyanosis ?Psych:  No mood changes, alert and cooperative,seems happy ?AA is 3 ?Fall risk is low ? ?  01/10/2022  ?   8:58 AM 01/08/2021  ? 10:16 AM 11/25/2019  ?  8:40 AM  ?Depression screen PHQ 2/9  ?Decreased Interest 0 0 0  ?Down, Depressed, Hopeless 1 1 1   ?PHQ - 2 Score 1 1 1   ?Altered sleeping 0 0   ?Tired, decreased energy 1 1   ?Change in appetite 0 0   ?Feeling bad or failure about yourself  0 0   ?Trouble concentrating 0 0   ?Moving slowly or fidgety/restless 0 0   ?Suicidal thoughts 0 0   ?PHQ-9 Score 2 2   ?  ? ?  01/10/2022  ?  8:58 AM 01/08/2021  ? 10:16 AM  ?GAD 7 : Generalized Anxiety Score  ?Nervous, Anxious, on Edge 1 1  ?Control/stop worrying 1 1  ?Worry too much - different things 1 0  ?Trouble relaxing 1 0  ?Restless 0 0  ?Easily annoyed or irritable 1 2  ?Afraid - awful might happen 1 1  ?Total GAD 7 Score 6 5  ? ?  ? [facility] - 01/10/22 0851   ? ?  ? Pregnancy Intention Screening  ? Does the patient want to become pregnant in the next year? No   ? Does the patient's partner want to become pregnant in the next year? No   ? Would the patient like to discuss contraceptive options today? Yes   ?  ? Contraception Wrap Up  ? Current Method Oral Contraceptive   ? End Method Female Condom   ? Contraception Counseling Provided Yes   ? ?  ?  ? ?  ?  ?  Examination chaperoned by Glenard Haring RN ? ?Impression and Plan: ?1. Encounter for gynecological examination with Papanicolaou smear of cervix ?Pap sent ?Physical in 1 year ?Pap in 3 years if normal ?Check CBC,CMP,TSH ?Continue to eat well and stay active ?Can stop Lo Loestrin when finished with this pack and use condoms, can resume if desired if bleeding returns, will check [facility] before restarting if off more at least a month  ? ?2. Encounter for screening fecal occult blood testing ?Hemoccult was negative  ? ?3. Screening for colorectal cancer ?Cologuard ordered after discussion  ? ?4. Screening for diabetes mellitus ?Will check A1c ? ?5. Elevated cholesterol ?Will check lipid panel, she is fasting ? ? ? ?  ?  ?

## 2022-01-11 LAB — COMPREHENSIVE METABOLIC PANEL
ALT: 17 IU/L (ref 0–32)
AST: 22 IU/L (ref 0–40)
Albumin/Globulin Ratio: 1.6 (ref 1.2–2.2)
Albumin: 4.4 g/dL (ref 3.8–4.8)
Alkaline Phosphatase: 50 IU/L (ref 44–121)
BUN/Creatinine Ratio: 10 (ref 9–23)
BUN: 8 mg/dL (ref 6–24)
Bilirubin Total: 0.3 mg/dL (ref 0.0–1.2)
CO2: 23 mmol/L (ref 20–29)
Calcium: 9.3 mg/dL (ref 8.7–10.2)
Chloride: 101 mmol/L (ref 96–106)
Creatinine, Ser: 0.77 mg/dL (ref 0.57–1.00)
Globulin, Total: 2.7 g/dL (ref 1.5–4.5)
Glucose: 94 mg/dL (ref 70–99)
Potassium: 4.8 mmol/L (ref 3.5–5.2)
Sodium: 140 mmol/L (ref 134–144)
Total Protein: 7.1 g/dL (ref 6.0–8.5)
eGFR: 95 mL/min/{1.73_m2} (ref >59)

## 2022-01-11 LAB — HEMOGLOBIN A1C
Est. average glucose Bld gHb Est-mCnc: 117 mg/dL
Hgb A1c MFr Bld: 5.7 % — ABNORMAL HIGH (ref 4.8–5.6)

## 2022-01-11 LAB — LIPID PANEL
Chol/HDL Ratio: 3.7 ratio (ref 0.0–4.4)
Cholesterol, Total: 246 mg/dL — ABNORMAL HIGH (ref 100–199)
HDL: 66 mg/dL (ref >39)
LDL Chol Calc (NIH): 160 mg/dL — ABNORMAL HIGH (ref 0–99)
Triglycerides: 112 mg/dL (ref 0–149)
VLDL Cholesterol Cal: 20 mg/dL (ref 5–40)

## 2022-01-11 LAB — CBC
Hematocrit: 45.3 % (ref 34.0–46.6)
Hemoglobin: 15 g/dL (ref 11.1–15.9)
MCH: 30.4 pg (ref 26.6–33.0)
MCHC: 33.1 g/dL (ref 31.5–35.7)
MCV: 92 fL (ref 79–97)
Platelets: 364 10*3/uL (ref 150–450)
RBC: 4.93 x10E6/uL (ref 3.77–5.28)
RDW: 11.8 % (ref 11.7–15.4)
WBC: 5.8 10*3/uL (ref 3.4–10.8)

## 2022-01-11 LAB — TSH: TSH: 1.19 u[IU]/mL (ref 0.450–4.500)

## 2022-01-14 LAB — CYTOLOGY - PAP
Comment: NEGATIVE
Diagnosis: NEGATIVE
High risk HPV: NEGATIVE

## 2022-01-29 LAB — COLOGUARD: COLOGUARD: NEGATIVE

## 2022-02-07 ENCOUNTER — Telehealth: Payer: Self-pay | Admitting: Adult Health

## 2022-02-07 NOTE — Telephone Encounter (Signed)
Returned pt's call, two identifiers used. Pt recently stopped Lo Loestrin and had not had a period since stopping approx 3 weeks ago. Pt was informed that it usually takes at least 3 months before she adjusts to not taking it. Also, her age may be playing a role as well. She was instructed to call and make an appt if she doesn't have a period at the end of the 3 months. Pt confirmed understanding.

## 2022-02-07 NOTE — Telephone Encounter (Signed)
Patient called stating that she would like to speak Jennifer's nurse, Patient did not state the reason for the call she states that it is personal and would like to speak with a nurse. Please contact pt

## 2022-06-10 ENCOUNTER — Other Ambulatory Visit (HOSPITAL_COMMUNITY): Payer: Self-pay | Admitting: Adult Health

## 2022-06-10 DIAGNOSIS — Z1231 Encounter for screening mammogram for malignant neoplasm of breast: Secondary | ICD-10-CM

## 2022-06-19 ENCOUNTER — Ambulatory Visit (HOSPITAL_COMMUNITY)
Admission: RE | Admit: 2022-06-19 | Discharge: 2022-06-19 | Disposition: A | Discharge status: Home / Self Care | Payer: 59 | Source: Ambulatory Visit | Attending: Adult Health | Admitting: Adult Health

## 2022-06-19 DIAGNOSIS — Z1231 Encounter for screening mammogram for malignant neoplasm of breast: Secondary | ICD-10-CM | POA: Diagnosis not present

## 2023-02-04 ENCOUNTER — Other Ambulatory Visit: Payer: Self-pay

## 2023-02-04 ENCOUNTER — Encounter (HOSPITAL_BASED_OUTPATIENT_CLINIC_OR_DEPARTMENT_OTHER): Payer: Self-pay | Admitting: Orthopaedic Surgery

## 2023-02-11 NOTE — H&P (Signed)
PREOPERATIVE H&P  Chief Complaint: right shoulder cartilage disorder, impingment, biceps tendinitis, rotator cuff tear  HPI: Betty Coleman is a 52 y.o. female who is scheduled for, Procedure(s): SHOULDER ARTHROSCOPY WITH SUBACROMIAL DECOMPRESSION, ROTATOR CUFF REPAIR AND BICEP TENDON REPAIR SHOULDER ARTHROSCOPY WITH DISTAL CLAVICLE EXCISION.   She is a healthy 52 year old we saw in October for her right shoulder. At that point she was doing well. She had previously had an injection and the injection was still working. Today, she notes the pain came back a few weeks later. She thought it would get better with time and doing her home exercises as well as physical therapy, but the pain has returned and is getting worse. She has now been dealing with it about a year. She is doing her home exercises still. She takes Meloxicam which helps on occasion. However, the pain has worsened and she is unable to do her activities of daily living due to the pain in the right shoulder. She denies any numbness or tingling  Symptoms are rated as moderate to severe, and have been worsening.  This is significantly impairing activities of daily living.    Please see clinic note for further details on this patient's care.    She has elected for surgical management.   Past Medical History:  Diagnosis Date   Contraceptive management 11/29/2013   Heart burn    Internal hemorrhage 11/29/2013   Had + hemoccult will send 3 cards home    Past Surgical History:  Procedure Laterality Date   CHOLECYSTECTOMY     Social History   Socioeconomic History   Marital status: Married    Spouse name: Not on file   Number of children: Not on file   Years of education: Not on file   Highest education level: Not on file  Occupational History   Not on file  Tobacco Use   Smoking status: Never   Smokeless tobacco: Never  Vaping Use   Vaping Use: Never used  Substance and Sexual Activity   Alcohol use: Yes     Comment: occ   Drug use: No   Sexual activity: Yes    Birth control/protection: Condom  Other Topics Concern   Not on file  Social History Narrative   Not on file   Social Determinants of Health   Financial Resource Strain: Low Risk  (01/10/2022)   Overall Financial Resource Strain (CARDIA)    Difficulty of Paying Living Expenses: Not very hard  Food Insecurity: No Food Insecurity (01/10/2022)   Hunger Vital Sign    Worried About Running Out of Food in the Last Year: Never true    Ran Out of Food in the Last Year: Never true  Transportation Needs: No Transportation Needs (01/10/2022)   PRAPARE - Administrator, Civil Service (Medical): No    Lack of Transportation (Non-Medical): No  Physical Activity: Insufficiently Active (01/10/2022)   Exercise Vital Sign    Days of Exercise per Week: 4 days    Minutes of Exercise per Session: 20 min  Stress: No Stress Concern Present (01/10/2022)   [employer] of Occupational Health - Occupational Stress Questionnaire    Feeling of Stress : Only a little  Social Connections: Socially Integrated (01/10/2022)   Social Connection and Isolation Panel [NHANES]    Frequency of Communication with Friends and Family: More than three times a week    Frequency of Social Gatherings with Friends and Family: Twice a week  Attends Religious Services: 1 to 4 times per year    Active Member of Clubs or Organizations: Yes    Attends Engineer, structural: More than 4 times per year    Marital Status: Married   Family History  Problem Relation Age of Onset   Cancer Paternal Grandfather        melanoma   Heart disease Maternal Grandmother    Heart disease Maternal Grandfather    Arthritis Mother    Hypertension Mother    Heart attack Mother    AAA (abdominal aortic aneurysm) Mother    Heart disease Maternal Uncle    No Known Allergies Prior to Admission medications   Medication Sig Start Date End Date Taking? Authorizing  Provider  ibuprofen (ADVIL,MOTRIN) 200 MG tablet Take 200-400 mg by mouth as needed.   Yes [provider]  Probiotic Product (PROBIOTIC PO) Take by mouth daily.   Yes [provider]    ROS: All other systems have been reviewed and were otherwise negative with the exception of those mentioned in the HPI and as above.  Physical Exam: General: Alert, no acute distress Cardiovascular: No pedal edema Respiratory: No cyanosis, no use of accessory musculature GI: No organomegaly, abdomen is soft and non-tender Skin: No lesions in the area of chief complaint Neurologic: Sensation intact distally Psychiatric: Patient is competent for consent with normal mood and affect Lymphatic: No axillary or cervical lymphadenopathy  MUSCULOSKELETAL:  Range of motion of the shoulder is to 170 degrees.  She has gross weakness supraspinatus and subscapularis testing.  Positive O'Briens.  Positive impingement.  Positive bicipital groove tenderness to palpation.  Imaging: MRI demonstrates a full-thickness cuff tear of the subscapularis and supraspinatus.  There is subluxation of the biceps over the lesser.  Assessment: right shoulder cartilage disorder, impingment, biceps tendinitis, rotator cuff tear  Plan: Plan for Procedure(s): SHOULDER ARTHROSCOPY WITH SUBACROMIAL DECOMPRESSION, ROTATOR CUFF REPAIR AND BICEP TENDON REPAIR SHOULDER ARTHROSCOPY WITH DISTAL CLAVICLE EXCISION  The risks benefits and alternatives were discussed with the patient including but not limited to the risks of nonoperative treatment, versus surgical intervention including infection, bleeding, nerve injury,  blood clots, cardiopulmonary complications, morbidity, mortality, among others, and they were willing to proceed.   The patient acknowledged the explanation, agreed to proceed with the plan and consent was signed.   Operative Plan: Right shoulder scope with rotator cuff repair, biceps tenodesis, subacromial  decompression Discharge Medications: standard DVT Prophylaxis: none Physical Therapy: outpatient PT Special Discharge needs: Sling (should bring with her). IceMan   Vernetta Honey, PA-C  02/11/2023 6:10 PM

## 2023-02-11 NOTE — Anesthesia Preprocedure Evaluation (Signed)
Anesthesia Evaluation  Patient identified by MRN, date of birth, ID band Patient awake    Reviewed: Allergy & Precautions, NPO status , Patient's Chart, lab work & pertinent test results  History of Anesthesia Complications Negative for: history of anesthetic complications  Airway Mallampati: II  TM Distance: >3 FB Neck ROM: Full    Dental no notable dental hx.    Pulmonary neg pulmonary ROS   Pulmonary exam normal        Cardiovascular negative cardio ROS Normal cardiovascular exam     Neuro/Psych negative neurological ROS  negative psych ROS   GI/Hepatic negative GI ROS, Neg liver ROS,,,  Endo/Other  negative endocrine ROS    Renal/GU negative Renal ROS  negative genitourinary   Musculoskeletal right shoulder cartilage disorder, impingment, biceps tendinitis, rotator cuff tear   Abdominal   Peds  Hematology negative hematology ROS (+)   Anesthesia Other Findings Day of surgery medications reviewed with patient.  Reproductive/Obstetrics negative OB ROS                              Anesthesia Physical Anesthesia Plan  ASA: 1  Anesthesia Plan: General   Post-op Pain Management: Tylenol PO (pre-op)* and Regional block*   Induction: Intravenous  PONV Risk Score and Plan: 3 and Treatment may vary due to age or medical condition, Ondansetron, Dexamethasone and Midazolam  Airway Management Planned: Oral ETT  Additional Equipment: None  Intra-op Plan:   Post-operative Plan: Extubation in OR  Informed Consent: I have reviewed the patients History and Physical, chart, labs and discussed the procedure including the risks, benefits and alternatives for the proposed anesthesia with the patient or authorized representative who has indicated his/her understanding and acceptance.     Dental advisory given  Plan Discussed with: CRNA  Anesthesia Plan Comments:          Anesthesia Quick Evaluation

## 2023-02-11 NOTE — Discharge Instructions (Signed)
Ramond Marrow MD, MPH Alfonse Alpers, PA-C [facility] [address], [address] [phone] (tel)   [phone] (fax)   POST-OPERATIVE INSTRUCTIONS - SHOULDER ARTHROSCOPY  WOUND CARE You may remove the Operative Dressing on Post-Op Day #3 (72hrs after surgery).   Alternatively if you would like you can leave dressing on until follow-up if within 7-8 days but keep it dry. Leave steri-strips in place until they fall off on their own, usually 2 weeks postop. There may be a small amount of fluid/bleeding leaking at the surgical site.  This is normal; the shoulder is filled with fluid during the procedure and can leak for 24-48hrs after surgery.  You may change/reinforce the bandage as needed.  Use the Cryocuff or Ice as often as possible for the first 7 days, then as needed for pain relief. Always keep a towel, ACE wrap or other barrier between the cooling unit and your skin.  You may shower on Post-Op Day #3. Gently pat the area dry.  Do not soak the shoulder in water or submerge it.  Keep incisions as dry as possible. Do not go swimming in the pool or ocean until 4 weeks after surgery or when otherwise instructed.    EXERCISES Wear the sling at all times  You may remove the sling for showering, but keep the arm across the chest or in a secondary sling.     It is normal for your fingers/hand to become more swollen after surgery and discolored from bruising.   This will resolve over the first few weeks usually after surgery. Please continue to ambulate and do not stay sitting or lying for too long.  Perform foot and wrist pumps to assist in circulation.  PHYSICAL THERAPY - You will begin physical therapy soon after surgery (unless otherwise specified)  - A PT referral was sent to [facility] in [place] - Please call their office to schedule post-op physical therapy if you have not already done so  REGIONAL ANESTHESIA (NERVE BLOCKS) The anesthesia team may have  performed a nerve block for you this is a great tool used to minimize pain.   The block may start wearing off overnight (between 8-24 hours postop) When the block wears off, your pain may go from nearly zero to the pain you would have had postop without the block. This is an abrupt transition but nothing dangerous is happening.   This can be a challenging period but utilize your as needed pain medications to try and manage this period. We suggest you use the pain medication the first night prior to going to bed, to ease this transition.  You may take an extra dose of narcotic when this happens if needed  POST-OP MEDICATIONS- Multimodal approach to pain control In general your pain will be controlled with a combination of substances.  Prescriptions unless otherwise discussed are electronically sent to your pharmacy.  This is a carefully made plan we use to minimize narcotic use.     Meloxicam - Anti-inflammatory medication taken on a scheduled basis Acetaminophen - Non-narcotic pain medicine taken on a scheduled basis  Oxycodone - This is a strong narcotic, to be used only on an "as needed" basis for SEVERE pain. Zofran - take as needed for nausea   FOLLOW-UP If you develop a Fever (?101.5), Redness or Drainage from the surgical incision site, please call our office to arrange for an evaluation. Please call the office to schedule a follow-up appointment for your first post-operative appointment,  7-10 days post-operatively.    HELPFUL INFORMATION   You may be more comfortable sleeping in a semi-seated position the first few nights following surgery.  Keep a pillow propped under the elbow and forearm for comfort.  If you have a recliner type of chair it might be beneficial.  If not that is fine too, but it would be helpful to sleep propped up with pillows behind your operated shoulder as well under your elbow and forearm.  This will reduce pulling on the suture lines.  When dressing, put  your operative arm in the sleeve first.  When getting undressed, take your operative arm out last.  Loose fitting, button-down shirts are recommended.  Often in the first days after surgery you may be more comfortable keeping your operative arm under your shirt and not through the sleeve.  You may return to work/school in the next couple of days when you feel up to it.  Desk work and typing in the sling is fine.  We suggest you use the pain medication the first night prior to going to bed, in order to ease any pain when the anesthesia wears off. You should avoid taking pain medications on an empty stomach as it will make you nauseous.  You should wean off your narcotic medicines as soon as you are able.  Most patients will be off narcotics before their first postop appointment.   Do not drink alcoholic beverages or take illicit drugs when taking pain medications.  It is against the law to drive while taking narcotics.  In some states it is against the law to drive while your arm is in a sling.   Pain medication may make you constipated.  Below are a few solutions to try in this order: Decrease the amount of pain medication if you aren't having pain. Drink lots of decaffeinated fluids. Drink prune juice and/or eat dried prunes  If the first 3 don't work start with additional solutions Take Colace - an over-the-counter stool softener Take Senokot - an over-the-counter laxative Take Miralax - a stronger over-the-counter laxative  For more information including helpful videos and documents visit our website:   [email or web address]       Post Anesthesia Home Care Instructions  Activity: Get plenty of rest for the remainder of the day. A responsible individual must stay with you for 24 hours following the procedure.  For the next 24 hours, DO NOT: -Drive a car -Advertising copywriter -Drink alcoholic beverages -Take any medication unless instructed by your  physician -Make any legal decisions or sign important papers.  Meals: Start with liquid foods such as gelatin or soup. Progress to regular foods as tolerated. Avoid greasy, spicy, heavy foods. If nausea and/or vomiting occur, drink only clear liquids until the nausea and/or vomiting subsides. Call your physician if vomiting continues.  Special Instructions/Symptoms: Your throat may feel dry or sore from the anesthesia or the breathing tube placed in your throat during surgery. If this causes discomfort, gargle with warm salt water. The discomfort should disappear within 24 hours.  If you had a scopolamine patch placed behind your ear for the management of post- operative nausea and/or vomiting:  1. The medication in the patch is effective for 72 hours, after which it should be removed.  Wrap patch in a tissue and discard in the trash. Wash hands thoroughly with soap and water. 2. You may remove the patch earlier than 72 hours if you experience unpleasant side effects which may include dry mouth,  dizziness or visual disturbances. 3. Avoid touching the patch. Wash your hands with soap and water after contact with the patch.   Regional Anesthesia Blocks  1. Numbness or the inability to move the "blocked" extremity may last from 3-48 hours after placement. The length of time depends on the medication injected and your individual response to the medication. If the numbness is not going away after 48 hours, call your surgeon.  2. The extremity that is blocked will need to be protected until the numbness is gone and the  Strength has returned. Because you cannot feel it, you will need to take extra care to avoid injury. Because it may be weak, you may have difficulty moving it or using it. You may not know what position it is in without looking at it while the block is in effect.  3. For blocks in the legs and feet, returning to weight bearing and walking needs to be done carefully. You will need to  wait until the numbness is entirely gone and the strength has returned. You should be able to move your leg and foot normally before you try and bear weight or walk. You will need someone to be with you when you first try to ensure you do not fall and possibly risk injury.  4. Bruising and tenderness at the needle site are common side effects and will resolve in a few days.  5. Persistent numbness or new problems with movement should be communicated to the surgeon or the [facility] [phone] [facility] [phone]).  Information for Discharge Teaching: EXPAREL (bupivacaine liposome injectable suspension)   Your surgeon or anesthesiologist gave you EXPAREL(bupivacaine) to help control your pain after surgery.  EXPAREL is a local anesthetic that provides pain relief by numbing the tissue around the surgical site. EXPAREL is designed to release pain medication over time and can control pain for up to 72 hours. Depending on how you respond to EXPAREL, you may require less pain medication during your recovery.  Possible side effects: Temporary loss of sensation or ability to move in the area where bupivacaine was injected. Nausea, vomiting, constipation Rarely, numbness and tingling in your mouth or lips, lightheadedness, or anxiety may occur. Call your doctor right away if you think you may be experiencing any of these sensations, or if you have other questions regarding possible side effects.  Follow all other discharge instructions given to you by your surgeon or nurse. Eat a healthy diet and drink plenty of water or other fluids.  If you return to the hospital for any reason within 96 hours following the administration of EXPAREL, it is important for health care providers to know that you have received this anesthetic. A teal colored band has been placed on your arm with the date, time and amount of EXPAREL you have received in order to alert and inform your  health care providers. Please leave this armband in place for the full 96 hours following administration, and then you may remove the band.   Next dose of Tylenol may be given at 12:30pm if needed.

## 2023-02-12 ENCOUNTER — Ambulatory Visit (HOSPITAL_BASED_OUTPATIENT_CLINIC_OR_DEPARTMENT_OTHER)
Admission: RE | Admit: 2023-02-12 | Discharge: 2023-02-12 | Disposition: A | Discharge status: Home / Self Care | Payer: 59 | Source: Ambulatory Visit | Attending: Orthopaedic Surgery | Admitting: Orthopaedic Surgery

## 2023-02-12 ENCOUNTER — Other Ambulatory Visit: Payer: Self-pay

## 2023-02-12 ENCOUNTER — Ambulatory Visit (HOSPITAL_BASED_OUTPATIENT_CLINIC_OR_DEPARTMENT_OTHER): Payer: 59 | Admitting: Anesthesiology

## 2023-02-12 ENCOUNTER — Encounter (HOSPITAL_BASED_OUTPATIENT_CLINIC_OR_DEPARTMENT_OTHER)
Admission: RE | Disposition: A | Discharge status: Home / Self Care | Payer: Self-pay | Source: Ambulatory Visit | Attending: Orthopaedic Surgery

## 2023-02-12 ENCOUNTER — Encounter (HOSPITAL_BASED_OUTPATIENT_CLINIC_OR_DEPARTMENT_OTHER): Payer: Self-pay | Admitting: Orthopaedic Surgery

## 2023-02-12 DIAGNOSIS — X58XXXA Exposure to other specified factors, initial encounter: Secondary | ICD-10-CM | POA: Insufficient documentation

## 2023-02-12 DIAGNOSIS — S46011A Strain of muscle(s) and tendon(s) of the rotator cuff of right shoulder, initial encounter: Secondary | ICD-10-CM | POA: Insufficient documentation

## 2023-02-12 DIAGNOSIS — M25811 Other specified joint disorders, right shoulder: Secondary | ICD-10-CM | POA: Insufficient documentation

## 2023-02-12 DIAGNOSIS — M7521 Bicipital tendinitis, right shoulder: Secondary | ICD-10-CM

## 2023-02-12 DIAGNOSIS — Z01818 Encounter for other preprocedural examination: Secondary | ICD-10-CM

## 2023-02-12 DIAGNOSIS — M7541 Impingement syndrome of right shoulder: Secondary | ICD-10-CM | POA: Diagnosis not present

## 2023-02-12 DIAGNOSIS — S43431A Superior glenoid labrum lesion of right shoulder, initial encounter: Secondary | ICD-10-CM | POA: Insufficient documentation

## 2023-02-12 HISTORY — PX: SHOULDER ARTHROSCOPY WITH SUBACROMIAL DECOMPRESSION, ROTATOR CUFF REPAIR AND BICEP TENDON REPAIR: SHX5687

## 2023-02-12 HISTORY — PX: SHOULDER ARTHROSCOPY WITH DISTAL CLAVICLE RESECTION: SHX5675

## 2023-02-12 LAB — POCT PREGNANCY, URINE: Preg Test, Ur: NEGATIVE

## 2023-02-12 SURGERY — SHOULDER ARTHROSCOPY WITH SUBACROMIAL DECOMPRESSION, ROTATOR CUFF REPAIR AND BICEP TENDON REPAIR
Anesthesia: General | Site: Shoulder | Laterality: Right

## 2023-02-12 MED ORDER — GABAPENTIN 300 MG PO CAPS
300.0000 mg | ORAL_CAPSULE | Freq: Once | ORAL | Status: AC
  Administered 2023-02-12: 300 mg via ORAL

## 2023-02-12 MED ORDER — ONDANSETRON HCL 4 MG/2ML IJ SOLN
INTRAMUSCULAR | Status: DC | PRN
  Administered 2023-02-12: 4 mg via INTRAVENOUS

## 2023-02-12 MED ORDER — PHENYLEPHRINE HCL (PRESSORS) 10 MG/ML IV SOLN
INTRAVENOUS | Status: DC | PRN
  Administered 2023-02-12: 160 ug via INTRAVENOUS
  Administered 2023-02-12: 80 ug via INTRAVENOUS
  Administered 2023-02-12: 160 ug via INTRAVENOUS

## 2023-02-12 MED ORDER — CEFAZOLIN SODIUM-DEXTROSE 2-4 GM/100ML-% IV SOLN
INTRAVENOUS | Status: AC
  Filled 2023-02-12: qty 100

## 2023-02-12 MED ORDER — BUPIVACAINE LIPOSOME 1.3 % IJ SUSP
INTRAMUSCULAR | Status: DC | PRN
  Administered 2023-02-12: 10 mL via PERINEURAL

## 2023-02-12 MED ORDER — ACETAMINOPHEN 500 MG PO TABS: 1000.0000 mg | ORAL_TABLET | Freq: Once | ORAL | Status: DC

## 2023-02-12 MED ORDER — OXYCODONE HCL 5 MG PO TABS: 5.0000 mg | ORAL_TABLET | Freq: Once | ORAL | Status: DC | PRN

## 2023-02-12 MED ORDER — OXYCODONE HCL 5 MG PO TABS: ORAL_TABLET | ORAL | 0 refills | Status: AC

## 2023-02-12 MED ORDER — DEXAMETHASONE SODIUM PHOSPHATE 10 MG/ML IJ SOLN
INTRAMUSCULAR | Status: DC | PRN
  Administered 2023-02-12: 10 mg via INTRAVENOUS

## 2023-02-12 MED ORDER — AMISULPRIDE (ANTIEMETIC) 5 MG/2ML IV SOLN: 10.0000 mg | Freq: Once | INTRAVENOUS | Status: DC | PRN

## 2023-02-12 MED ORDER — MIDAZOLAM HCL 2 MG/2ML IJ SOLN
INTRAMUSCULAR | Status: AC
  Filled 2023-02-12: qty 2

## 2023-02-12 MED ORDER — SUGAMMADEX SODIUM 200 MG/2ML IV SOLN
INTRAVENOUS | Status: DC | PRN
  Administered 2023-02-12: 150 mg via INTRAVENOUS

## 2023-02-12 MED ORDER — FENTANYL CITRATE (PF) 100 MCG/2ML IJ SOLN: 25.0000 ug | INTRAMUSCULAR | Status: DC | PRN

## 2023-02-12 MED ORDER — PHENYLEPHRINE HCL-NACL 20-0.9 MG/250ML-% IV SOLN
INTRAVENOUS | Status: DC | PRN
  Administered 2023-02-12: 25 ug/min via INTRAVENOUS

## 2023-02-12 MED ORDER — TRANEXAMIC ACID-NACL 1000-0.7 MG/100ML-% IV SOLN
1000.0000 mg | INTRAVENOUS | Status: AC
  Administered 2023-02-12: 1000 mg via INTRAVENOUS

## 2023-02-12 MED ORDER — ROCURONIUM BROMIDE 10 MG/ML (PF) SYRINGE
PREFILLED_SYRINGE | INTRAVENOUS | Status: AC
  Filled 2023-02-12: qty 10

## 2023-02-12 MED ORDER — ATROPINE SULFATE 0.4 MG/ML IV SOLN
INTRAVENOUS | Status: AC
  Filled 2023-02-12: qty 1

## 2023-02-12 MED ORDER — SODIUM CHLORIDE 0.9 % IR SOLN
Status: DC | PRN
  Administered 2023-02-12 (×3): 3000 mL

## 2023-02-12 MED ORDER — PROPOFOL 10 MG/ML IV BOLUS
INTRAVENOUS | Status: AC
  Filled 2023-02-12: qty 20

## 2023-02-12 MED ORDER — FENTANYL CITRATE (PF) 100 MCG/2ML IJ SOLN
100.0000 ug | Freq: Once | INTRAMUSCULAR | Status: AC
  Administered 2023-02-12: 50 ug via INTRAVENOUS

## 2023-02-12 MED ORDER — CEFAZOLIN SODIUM-DEXTROSE 2-4 GM/100ML-% IV SOLN
2.0000 g | INTRAVENOUS | Status: AC
  Administered 2023-02-12: 2 g via INTRAVENOUS

## 2023-02-12 MED ORDER — GABAPENTIN 300 MG PO CAPS
ORAL_CAPSULE | ORAL | Status: AC
  Filled 2023-02-12: qty 1

## 2023-02-12 MED ORDER — OXYCODONE HCL 5 MG/5ML PO SOLN: 5.0000 mg | Freq: Once | ORAL | Status: DC | PRN

## 2023-02-12 MED ORDER — FENTANYL CITRATE (PF) 100 MCG/2ML IJ SOLN
INTRAMUSCULAR | Status: AC
  Filled 2023-02-12: qty 2

## 2023-02-12 MED ORDER — SODIUM CHLORIDE (PF) 0.9 % IJ SOLN
INTRAMUSCULAR | Status: DC | PRN
  Administered 2023-02-12 (×2): 3000 mL

## 2023-02-12 MED ORDER — PROPOFOL 10 MG/ML IV BOLUS
INTRAVENOUS | Status: DC | PRN
  Administered 2023-02-12: 120 mg via INTRAVENOUS

## 2023-02-12 MED ORDER — LIDOCAINE 2% (20 MG/ML) 5 ML SYRINGE
INTRAMUSCULAR | Status: AC
  Filled 2023-02-12: qty 5

## 2023-02-12 MED ORDER — PHENYLEPHRINE HCL (PRESSORS) 10 MG/ML IV SOLN
INTRAVENOUS | Status: AC
  Filled 2023-02-12: qty 1

## 2023-02-12 MED ORDER — MELOXICAM 7.5 MG PO TABS: 7.5000 mg | ORAL_TABLET | Freq: Two times a day (BID) | ORAL | 0 refills | Status: AC

## 2023-02-12 MED ORDER — PHENYLEPHRINE 80 MCG/ML (10ML) SYRINGE FOR IV PUSH (FOR BLOOD PRESSURE SUPPORT)
PREFILLED_SYRINGE | INTRAVENOUS | Status: AC
  Filled 2023-02-12: qty 10

## 2023-02-12 MED ORDER — ACETAMINOPHEN 500 MG PO TABS
1000.0000 mg | ORAL_TABLET | Freq: Once | ORAL | Status: AC
  Administered 2023-02-12: 1000 mg via ORAL

## 2023-02-12 MED ORDER — ACETAMINOPHEN 500 MG PO TABS
ORAL_TABLET | ORAL | Status: AC
  Filled 2023-02-12: qty 2

## 2023-02-12 MED ORDER — ONDANSETRON HCL 4 MG/2ML IJ SOLN
INTRAMUSCULAR | Status: AC
  Filled 2023-02-12: qty 2

## 2023-02-12 MED ORDER — BUPIVACAINE-EPINEPHRINE (PF) 0.5% -1:200000 IJ SOLN
INTRAMUSCULAR | Status: DC | PRN
  Administered 2023-02-12: 15 mL via PERINEURAL

## 2023-02-12 MED ORDER — ACETAMINOPHEN 500 MG PO TABS: 1000.0000 mg | ORAL_TABLET | Freq: Three times a day (TID) | ORAL | 0 refills | Status: AC

## 2023-02-12 MED ORDER — LIDOCAINE 2% (20 MG/ML) 5 ML SYRINGE
INTRAMUSCULAR | Status: DC | PRN
  Administered 2023-02-12: 60 mg via INTRAVENOUS

## 2023-02-12 MED ORDER — TRANEXAMIC ACID-NACL 1000-0.7 MG/100ML-% IV SOLN
INTRAVENOUS | Status: AC
  Filled 2023-02-12: qty 100

## 2023-02-12 MED ORDER — LACTATED RINGERS IV SOLN: INTRAVENOUS | Status: DC

## 2023-02-12 MED ORDER — EPINEPHRINE PF 1 MG/ML IJ SOLN
INTRAMUSCULAR | Status: AC
  Filled 2023-02-12: qty 1

## 2023-02-12 MED ORDER — ROCURONIUM BROMIDE 100 MG/10ML IV SOLN
INTRAVENOUS | Status: DC | PRN
  Administered 2023-02-12: 50 mg via INTRAVENOUS

## 2023-02-12 MED ORDER — ONDANSETRON HCL 4 MG PO TABS: 4.0000 mg | ORAL_TABLET | Freq: Three times a day (TID) | ORAL | 0 refills | Status: AC | PRN

## 2023-02-12 MED ORDER — MIDAZOLAM HCL 2 MG/2ML IJ SOLN
2.0000 mg | Freq: Once | INTRAMUSCULAR | Status: AC
  Administered 2023-02-12: 2 mg via INTRAVENOUS

## 2023-02-12 MED ORDER — DEXAMETHASONE SODIUM PHOSPHATE 10 MG/ML IJ SOLN
INTRAMUSCULAR | Status: AC
  Filled 2023-02-12: qty 1

## 2023-02-12 MED ORDER — FENTANYL CITRATE (PF) 100 MCG/2ML IJ SOLN
INTRAMUSCULAR | Status: DC | PRN
  Administered 2023-02-12: 50 ug via INTRAVENOUS

## 2023-02-12 SURGICAL SUPPLY — 56 items
AID PSTN UNV HD RSTRNT DISP (MISCELLANEOUS) ×1
ANCH SUT 2 SWLK 19.1 CLS EYLT (Anchor) ×2 IMPLANT
ANCH SUT FBRTAPE 1.3X2.6X1.7 (Anchor) ×1 IMPLANT
ANCHOR SUT FBRTK 2.6 SOFT 1.7 (Anchor) IMPLANT
ANCHOR SWIVELOCK BIO 4.75X19.1 (Anchor) IMPLANT
APL PRP STRL LF DISP 70% ISPRP (MISCELLANEOUS) ×1
BLADE EXCALIBUR 4.0X13 (MISCELLANEOUS) ×1 IMPLANT
BURR OVAL 8 FLU 4.0X13 (MISCELLANEOUS) ×1 IMPLANT
CANNULA 5.75X71 LONG (CANNULA) IMPLANT
CANNULA PASSPORT 5 (CANNULA) IMPLANT
CANNULA PASSPORT BUTTON 10-40 (CANNULA) IMPLANT
CANNULA TWIST IN 8.25X7CM (CANNULA) IMPLANT
CHLORAPREP W/TINT 26 (MISCELLANEOUS) ×1 IMPLANT
CLSR STERI-STRIP ANTIMIC 1/2X4 (GAUZE/BANDAGES/DRESSINGS) ×1 IMPLANT
COOLER ICEMAN CLASSIC (MISCELLANEOUS) ×1 IMPLANT
DRAPE IMP U-DRAPE 54X76 (DRAPES) ×1 IMPLANT
DRAPE INCISE IOBAN 66X45 STRL (DRAPES) IMPLANT
DRAPE SHOULDER BEACH CHAIR (DRAPES) ×1 IMPLANT
DW OUTFLOW CASSETTE/TUBE SET (MISCELLANEOUS) ×1 IMPLANT
GAUZE PAD ABD 8X10 STRL (GAUZE/BANDAGES/DRESSINGS) ×1 IMPLANT
GAUZE SPONGE 4X4 12PLY STRL (GAUZE/BANDAGES/DRESSINGS) ×1 IMPLANT
GLOVE BIO SURGEON STRL SZ 6.5 (GLOVE) ×1 IMPLANT
GLOVE BIOGEL PI IND STRL 6.5 (GLOVE) ×1 IMPLANT
GLOVE BIOGEL PI IND STRL 8 (GLOVE) ×1 IMPLANT
GLOVE ECLIPSE 8.0 STRL XLNG CF (GLOVE) ×1 IMPLANT
GOWN STRL REUS W/ TWL LRG LVL3 (GOWN DISPOSABLE) ×2 IMPLANT
GOWN STRL REUS W/TWL LRG LVL3 (GOWN DISPOSABLE) ×2
GOWN STRL REUS W/TWL XL LVL3 (GOWN DISPOSABLE) ×1 IMPLANT
KIT STABILIZATION SHOULDER (MISCELLANEOUS) ×1 IMPLANT
KIT STR SPEAR 1.8 FBRTK DISP (KITS) IMPLANT
LASSO CRESCENT QUICKPASS (SUTURE) IMPLANT
MANIFOLD NEPTUNE II (INSTRUMENTS) ×1 IMPLANT
NDL HD SCORPION MEGA LOADER (NEEDLE) IMPLANT
NDL SAFETY ECLIP 18X1.5 (MISCELLANEOUS) ×1 IMPLANT
PACK ARTHROSCOPY DSU (CUSTOM PROCEDURE TRAY) ×1 IMPLANT
PACK BASIN DAY SURGERY FS (CUSTOM PROCEDURE TRAY) ×1 IMPLANT
PAD COLD SHLDR WRAP-ON (PAD) ×1 IMPLANT
PORT APPOLLO RF 90DEGREE MULTI (SURGICAL WAND) ×1 IMPLANT
RESTRAINT HEAD UNIVERSAL NS (MISCELLANEOUS) ×1 IMPLANT
SHEET MEDIUM DRAPE 40X70 STRL (DRAPES) ×1 IMPLANT
SLEEVE SCD COMPRESS KNEE MED (STOCKING) ×1 IMPLANT
SLING ARM FOAM STRAP LRG (SOFTGOODS) IMPLANT
SUT FIBERWIRE #2 38 T-5 BLUE (SUTURE)
SUT MNCRL AB 4-0 PS2 18 (SUTURE) ×1 IMPLANT
SUT PDS AB 0 CT 36 (SUTURE) IMPLANT
SUT PDS AB 1 CT  36 (SUTURE)
SUT PDS AB 1 CT 36 (SUTURE) IMPLANT
SUT TIGER TAPE 7 IN WHITE (SUTURE) IMPLANT
SUTURE FIBERWR #2 38 T-5 BLUE (SUTURE) IMPLANT
SUTURE TAPE TIGERLINK 1.3MM BL (SUTURE) IMPLANT
SUTURETAPE TIGERLINK 1.3MM BL (SUTURE)
SYR 5ML LL (SYRINGE) ×1 IMPLANT
TAPE FIBER 2MM 7IN #2 BLUE (SUTURE) IMPLANT
TOWEL GREEN STERILE FF (TOWEL DISPOSABLE) ×2 IMPLANT
TUBE CONNECTING 20X1/4 (TUBING) ×1 IMPLANT
TUBING ARTHROSCOPY IRRIG 16FT (MISCELLANEOUS) ×1 IMPLANT

## 2023-02-12 NOTE — Interval H&P Note (Signed)
All questions answered, patient wants to proceed with procedure. ? ?

## 2023-02-12 NOTE — Transfer of Care (Signed)
Immediate Anesthesia Transfer of Care Note  Patient: Betty Coleman  Procedure(s) Performed: SHOULDER ARTHROSCOPY WITH SUBACROMIAL DECOMPRESSION, ROTATOR CUFF REPAIR AND BICEP TENDON REPAIR (Right: Shoulder) SHOULDER ARTHROSCOPY WITH DISTAL CLAVICLE EXCISION (Right: Shoulder)  Patient Location: PACU  Anesthesia Type:GA combined with regional for post-op pain  Level of Consciousness: drowsy  Airway & Oxygen Therapy: Patient Spontanous Breathing and Patient connected to face mask oxygen  Post-op Assessment: Report given to RN and Post -op Vital signs reviewed and stable  Post vital signs: Reviewed and stable  Last Vitals:  Vitals Value Taken Time  BP 164/73 02/12/23 0912  Temp    Pulse 71 02/12/23 0914  Resp 14 02/12/23 0914  SpO2 100 % 02/12/23 0914  Vitals shown include unvalidated device data.  Last Pain:  Vitals:   02/12/23 0630  PainSc: 0-No pain      Patients Stated Pain Goal: 3 (02/12/23 0630)  Complications: No notable events documented.

## 2023-02-12 NOTE — Progress Notes (Signed)
Assisted Dr. Howze with right, interscalene , ultrasound guided block. Side rails up, monitors on throughout procedure. See vital signs in flow sheet. Tolerated Procedure well. 

## 2023-02-12 NOTE — Anesthesia Procedure Notes (Signed)
Procedure Name: Intubation Date/Time: 02/12/2023 7:54 AM  Performed by: Lauralyn Primes, CRNAPre-anesthesia Checklist: Patient identified, Emergency Drugs available, Suction available and Patient being monitored Patient Re-evaluated:Patient Re-evaluated prior to induction Oxygen Delivery Method: Circle system utilized Preoxygenation: Pre-oxygenation with 100% oxygen Induction Type: IV induction Ventilation: Mask ventilation without difficulty Laryngoscope Size: Mac and 3 Grade View: Grade I Tube type: Oral Tube size: 7.0 mm Number of attempts: 1 Airway Equipment and Method: Stylet and Bite block Placement Confirmation: ETT inserted through vocal cords under direct vision, positive ETCO2 and breath sounds checked- equal and bilateral Secured at: 23 cm Tube secured with: Tape Dental Injury: Teeth and Oropharynx as per pre-operative assessment

## 2023-02-12 NOTE — Op Note (Signed)
Orthopaedic Surgery Operative Note (CSN: [id number])  Betty Coleman  Jul 13, 1971 Date of Surgery: 02/12/2023   DIAGNOSES: Right shoulder, acute traumatic rotator cuff tear, SLAP tear, biceps tendinitis, and subacromial impingement.  POST-OPERATIVE DIAGNOSIS: same  PROCEDURE: Arthroscopic extensive debridement - 29823 Subdeltoid Bursa, Supraspinatus Tendon, Anterior Labrum, Superior Labrum, and glenoid bone, glenoid cartilage, humeral bone and humeral cartilage Arthroscopic subacromial decompression - 08657 Arthroscopic rotator cuff repair - 84696 Arthroscopic biceps tenodesis - 29528   OPERATIVE FINDING: Patient's cartilage surfaces were pristine, there is anterior posterior and superior labral tearing.  There is a SLAP tear.  Biceps and medialized there is a full-thickness subscapularis tear.  We mobilized the tissue and were able to perform to row repair of the subscapularis with reasonable fixation though the tissue was under relatively high tension.  There was a mid substance supraspinatus full-thickness tear.  We are able to perform a speed fix repair of this.  Biceps tenodesis was performed with the lateral anchor from the subscapularis.  Patient will be gentle but can start early therapy with avoidance of external rotation the setting of a subscapularis tear.    Post-operative plan: The patient will be non-weightbearing in a sling for 6 weeks.  The patient will be discharged home.  DVT prophylaxis not indicated in ambulatory upper extremity patient without known risk factors.   Pain control with PRN pain medication preferring oral medicines.  Follow up plan will be scheduled in approximately 7 days for incision check and XR.  Surgeons:Primary: Bjorn Pippin, MD Assistants:Caroline McBane PA-C Location: [facility] OR ROOM 6 Anesthesia: General with Exparel interscalene block Antibiotics: Ancef 2 g Tourniquet time: None Estimated Blood Loss: Minimal Complications: None Specimens:  None Implants: Implant Name Type Inv. Item Serial No. Manufacturer Lot No. LRB No. Used Action  ANCHOR SUT FBRTK 2.6 SOFT 1.7 - [id number] Anchor ANCHOR SUT FBRTK 2.6 SOFT 1.7  [employer] [id number] Right 1 Implanted  [facility] BIO 4.75X19.1 - [id number] Anchor ANCHOR SWIVELOCK BIO 4.75X19.1  [employer] [id number] Right 1 Implanted  [facility] BIO 4.75X19.1 - [id number] Anchor ANCHOR SWIVELOCK BIO 4.75X19.1  Marcie Bal [id number] Right 1 Implanted    Indications for Surgery:   Betty Coleman is a 52 y.o. female with continued shoulder pain refractory to nonoperative measures for extended period of time.    The risks and benefits were explained at length including but not limited to continued pain, cuff failure, biceps tenodesis failure, stiffness, need for further surgery and infection.   Procedure:   Patient was correctly identified in the preoperative holding area and operative site marked.  Patient brought to OR and positioned beachchair on an [place]et table ensuring that all bony prominences were padded and the head was in an appropriate location.  Anesthesia was induced and the operative shoulder was prepped and draped in the usual sterile fashion.  Timeout was called preincision.  A standard posterior viewing portal was made after localizing the portal with a spinal needle.  An anterior accessory portal was also made.  After clearing the articular space the camera was positioned in the subacromial space.  Findings above.    Extensive debridement was performed of the anterior interval tissue, labral fraying and the bursa.  Subacromial decompression: We made a lateral portal with spinal needle guidance. We then proceeded to debride bursal tissue extensively with a shaver and arthrocare device. At that point we continued to identify the borders of the acromion and identify the spur. We then carefully preserved the deltoid  fascia and used a burr to convert the acromion to a Type 1 flat  acromion without issue.  We identified the subscapularis tear.  We mobilized the tissue performing 3 sided release with an RF ablator and releasing the rotator interval.  We debrided the [facility] L significantly.  Humeral bone and humeral cartilage as well as glenoid and glenoid cartilage were debrided  This point we cleared the tuberosity for subscapularis healing.  Once were down to a bleeding surface we placed a 2 6 rotator cuff fiber tack anchor loaded with tapes.  We passed the tapes using a crescent device for medial row passage.  We then went to the subacromial space.  We cleared the bicipital groove and mobilized the biceps laterally.  We then were able to place both of our tapes into a 4.75 bio composite swivel lock placed in the bicipital groove obtaining good fixation.  We cut the sutures and used the knotless mechanism sutures to perform a luggage loop passage around the biceps tendon and tenodesed the biceps within the groove low.  We had good fixation.  We turned our attention to the superior cuff.  The full-thickness area of the supraspinatus tear was noted.  We debrided the tuberosity and prepared it for healing.  We then passed a inverted mattress fiber tape and were able to bring this over to a lateral 4.75 bio composite swivel lock obtaining good fixation. The incisions were closed with absorbable monocryl and steri strips.  A sterile dressing was placed along with a sling. The patient was awoken from general anesthesia and taken to the PACU in stable condition without complication.   Alfonse Alpers, PA-C, present and scrubbed throughout the case, critical for completion in a timely fashion, and for retraction, instrumentation, closure.  `

## 2023-02-12 NOTE — Anesthesia Procedure Notes (Addendum)
Anesthesia Regional Block: Interscalene brachial plexus block   Pre-Anesthetic Checklist: , timeout performed,  Correct Patient, Correct Site, Correct Laterality,  Correct Procedure, Correct Position, site marked,  Risks and benefits discussed,  Pre-op evaluation,  At surgeon's request and post-op pain management  Laterality: Right  Prep: Maximum Sterile Barrier Precautions used, chloraprep       Needles:  Injection technique: Single-shot  Needle Type: Echogenic Stimulator Needle     Needle Length: 4cm  Needle Gauge: 22     Additional Needles:   Procedures:,,,, ultrasound used (permanent image in chart),,    Narrative:  Start time: 02/12/2023 7:20 AM End time: 02/12/2023 7:23 AM Injection made incrementally with aspirations every 5 mL.  Performed by: Personally  Anesthesiologist: Kaylyn Layer, MD  Additional Notes: Risks, benefits, and alternative discussed. Patient gave consent for procedure. Patient prepped and draped in sterile fashion. Sedation administered, patient remains easily responsive to voice. Relevant anatomy identified with ultrasound guidance. Local anesthetic given in 5cc increments with no signs or symptoms of intravascular injection. No pain or paraesthesias with injection. Patient monitored throughout procedure with signs of LAST or immediate complications. Tolerated well. Ultrasound image placed in chart.  Amalia Greenhouse, MD

## 2023-02-12 NOTE — Anesthesia Postprocedure Evaluation (Signed)
Anesthesia Post Note  Patient: Betty Coleman  Procedure(s) Performed: SHOULDER ARTHROSCOPY WITH SUBACROMIAL DECOMPRESSION, ROTATOR CUFF REPAIR AND BICEP TENDON REPAIR (Right: Shoulder) SHOULDER ARTHROSCOPY WITH DISTAL CLAVICLE EXCISION (Right: Shoulder)     Patient location during evaluation: PACU Anesthesia Type: General Level of consciousness: awake and alert Pain management: pain level controlled Vital Signs Assessment: post-procedure vital signs reviewed and stable Respiratory status: spontaneous breathing, nonlabored ventilation and respiratory function stable Cardiovascular status: blood pressure returned to baseline Postop Assessment: no apparent nausea or vomiting Anesthetic complications: no   No notable events documented.  Last Vitals:  Vitals:   02/12/23 0930 02/12/23 0938  BP: (!) 162/84   Pulse: 81 82  Resp: 11 (!) 9  Temp:    SpO2: 99% 95%    Last Pain:  Vitals:   02/12/23 0938  PainSc: 0-No pain                 Shanda Howells

## 2023-02-13 ENCOUNTER — Encounter (HOSPITAL_BASED_OUTPATIENT_CLINIC_OR_DEPARTMENT_OTHER): Payer: Self-pay | Admitting: Orthopaedic Surgery

## 2023-06-03 ENCOUNTER — Encounter: Payer: Self-pay | Admitting: Adult Health

## 2023-06-03 ENCOUNTER — Ambulatory Visit (INDEPENDENT_AMBULATORY_CARE_PROVIDER_SITE_OTHER): Payer: 59 | Admitting: Adult Health

## 2023-06-03 VITALS — BP 130/80 | HR 72 | Ht 63.0 in | Wt 138.0 lb

## 2023-06-03 DIAGNOSIS — N951 Menopausal and female climacteric states: Secondary | ICD-10-CM

## 2023-06-03 DIAGNOSIS — Z01419 Encounter for gynecological examination (general) (routine) without abnormal findings: Secondary | ICD-10-CM | POA: Diagnosis not present

## 2023-06-03 DIAGNOSIS — Z1211 Encounter for screening for malignant neoplasm of colon: Secondary | ICD-10-CM

## 2023-06-03 DIAGNOSIS — R232 Flushing: Secondary | ICD-10-CM

## 2023-06-03 DIAGNOSIS — N926 Irregular menstruation, unspecified: Secondary | ICD-10-CM

## 2023-06-03 DIAGNOSIS — R7309 Other abnormal glucose: Secondary | ICD-10-CM

## 2023-06-03 DIAGNOSIS — E7800 Pure hypercholesterolemia, unspecified: Secondary | ICD-10-CM | POA: Diagnosis not present

## 2023-06-03 LAB — HEMOCCULT GUIAC POC 1CARD (OFFICE): Fecal Occult Blood, POC: NEGATIVE

## 2023-06-03 NOTE — Progress Notes (Signed)
Patient ID: Betty Coleman, female   DOB: [birth year], 52 y.o.   MRN: [id number] History of Present Illness: Betty Coleman is a 52 year old white female, married, G2P2002 in for a well woman gyn exam. She is having irregular periods and hot flashes, night sweats and is moody. She had right shoulder surgery in May of this year, doing well.      Component Value Date/Time   DIAGPAP  01/10/2022 0848    - Negative for intraepithelial lesion or malignancy (NILM)   DIAGPAP  10/13/2018 0000    NEGATIVE FOR INTRAEPITHELIAL LESIONS OR MALIGNANCY.   HPVHIGH Negative 01/10/2022 0848   ADEQPAP  01/10/2022 0848    Satisfactory for evaluation; transformation zone component PRESENT.   ADEQPAP  10/13/2018 0000    Satisfactory for evaluation  endocervical/transformation zone component ABSENT.   PCP is Dr Lilyan Punt  Current Medications, Allergies, Past Medical History, Past Surgical History, Family History and Social History were reviewed in [employer] electronic medical record.     Review of Systems: Patient denies any headaches, hearing loss, fatigue, blurred vision, shortness of breath, chest pain, abdominal pain, problems with bowel movements, urination, or intercourse. No joint pain or mood swings.  See HPI for positives   Physical Exam:BP 130/80 (BP Location: Left Arm, Patient Position: Sitting, Cuff Size: Normal)   Pulse 72   Ht 5\' 3"  (1.6 m)   Wt 138 lb (62.6 kg)   LMP 05/20/2023   BMI 24.45 kg/m   General:  Well developed, well nourished, no acute distress Skin:  Warm and dry Neck:  Midline trachea, normal thyroid, good ROM, no lymphadenopathy Lungs; Clear to auscultation bilaterally Breast:  No dominant palpable mass, retraction, or nipple discharge Cardiovascular: Regular rate and rhythm Abdomen:  Soft, non tender, no hepatosplenomegaly Pelvic:  External genitalia is normal in appearance, no lesions.  The vagina is normal in appearance. Urethra has no lesions or masses. The cervix is  smooth.  Uterus is felt to be normal size, shape, and contour.  No adnexal masses or tenderness noted.Bladder is non tender, no masses felt. Rectal: Good sphincter tone, no polyps, or hemorrhoids felt.  Hemoccult negative. Extremities/musculoskeletal:  No swelling or varicosities noted, no clubbing or cyanosis Psych:  No mood changes, alert and cooperative,seems happy AA is 3 Fall risk is low    06/03/2023    8:41 AM 01/10/2022    8:58 AM 01/08/2021   10:16 AM  Depression screen PHQ 2/9  Decreased Interest 1 0 0  Down, Depressed, Hopeless 1 1 1   PHQ - 2 Score 2 1 1   Altered sleeping 0 0 0  Tired, decreased energy 0 1 1  Change in appetite 0 0 0  Feeling bad or failure about yourself  1 0 0  Trouble concentrating 0 0 0  Moving slowly or fidgety/restless 0 0 0  Suicidal thoughts 1 0 0  PHQ-9 Score 4 2 2        06/03/2023    8:42 AM 01/10/2022    8:58 AM 01/08/2021   10:16 AM  GAD 7 : Generalized Anxiety Score  Nervous, Anxious, on Edge 0 1 1  Control/stop worrying 1 1 1   Worry too much - different things 1 1 0  Trouble relaxing 0 1 0  Restless 0 0 0  Easily annoyed or irritable 2 1 2   Afraid - awful might happen 1 1 1   Total GAD 7 Score 5 6 5     Upstream - 06/03/23 0840  Pregnancy Intention Screening   Does the patient want to become pregnant in the next year? No    Does the patient's partner want to become pregnant in the next year? No    Would the patient like to discuss contraceptive options today? No      Contraception Wrap Up   Current Method No Method - Other Reason    Reason for No Current Contraceptive Method at Intake ([facility] Only) Other    End Method No Method - Other Reason            Examination chaperoned by Malachy Mood LPN    Impression and plan: 1. Encounter for well woman exam with routine gynecological exam Physical in 1 year Pap in 2026 Will check labs, she is fasting - CBC - Comprehensive metabolic panel - Lipid panel Mammogram was  negative 06/19/22 Cologuard was negative 2023 Stay active  2. Encounter for screening fecal occult blood testing Hemoccult was negative  - POCT occult blood stool  3. Elevated cholesterol - Lipid panel  4. Elevated hemoglobin A1c - Hemoglobin A1c  5. Hot flashes  6. Perimenopause Discussed perimenopause symptoms and gave handout   7. Irregular periods

## 2023-06-04 LAB — CBC
Hematocrit: 42.5 % (ref 34.0–46.6)
Hemoglobin: 14.4 g/dL (ref 11.1–15.9)
MCH: 32.1 pg (ref 26.6–33.0)
MCHC: 33.9 g/dL (ref 31.5–35.7)
MCV: 95 fL (ref 79–97)
Platelets: 351 10*3/uL (ref 150–450)
RBC: 4.49 x10E6/uL (ref 3.77–5.28)
RDW: 11.9 % (ref 11.7–15.4)
WBC: 7.1 10*3/uL (ref 3.4–10.8)

## 2023-06-04 LAB — LIPID PANEL
Chol/HDL Ratio: 2.7 ratio (ref 0.0–4.4)
Cholesterol, Total: 222 mg/dL — ABNORMAL HIGH (ref 100–199)
HDL: 83 mg/dL (ref >39)
LDL Chol Calc (NIH): 128 mg/dL — ABNORMAL HIGH (ref 0–99)
Triglycerides: 64 mg/dL (ref 0–149)
VLDL Cholesterol Cal: 11 mg/dL (ref 5–40)

## 2023-06-04 LAB — COMPREHENSIVE METABOLIC PANEL
ALT: 19 IU/L (ref 0–32)
AST: 17 IU/L (ref 0–40)
Albumin: 4.3 g/dL (ref 3.8–4.9)
Alkaline Phosphatase: 76 IU/L (ref 44–121)
BUN/Creatinine Ratio: 15 (ref 9–23)
BUN: 11 mg/dL (ref 6–24)
Bilirubin Total: 0.3 mg/dL (ref 0.0–1.2)
CO2: 24 mmol/L (ref 20–29)
Calcium: 9.5 mg/dL (ref 8.7–10.2)
Chloride: 100 mmol/L (ref 96–106)
Creatinine, Ser: 0.75 mg/dL (ref 0.57–1.00)
Globulin, Total: 2.4 g/dL (ref 1.5–4.5)
Glucose: 94 mg/dL (ref 70–99)
Potassium: 4.6 mmol/L (ref 3.5–5.2)
Sodium: 139 mmol/L (ref 134–144)
Total Protein: 6.7 g/dL (ref 6.0–8.5)
eGFR: 96 mL/min/{1.73_m2} (ref >59)

## 2023-06-04 LAB — HEMOGLOBIN A1C
Est. average glucose Bld gHb Est-mCnc: 120 mg/dL
Hgb A1c MFr Bld: 5.8 % — ABNORMAL HIGH (ref 4.8–5.6)

## 2023-08-25 ENCOUNTER — Other Ambulatory Visit (HOSPITAL_COMMUNITY): Payer: Self-pay | Admitting: Adult Health

## 2023-08-25 DIAGNOSIS — Z1231 Encounter for screening mammogram for malignant neoplasm of breast: Secondary | ICD-10-CM

## 2023-09-04 ENCOUNTER — Ambulatory Visit (HOSPITAL_COMMUNITY)
Admission: RE | Admit: 2023-09-04 | Discharge: 2023-09-04 | Disposition: A | Discharge status: Home / Self Care | Payer: 59 | Source: Ambulatory Visit | Attending: Adult Health | Admitting: Adult Health

## 2023-09-04 DIAGNOSIS — Z1231 Encounter for screening mammogram for malignant neoplasm of breast: Secondary | ICD-10-CM | POA: Diagnosis present

## 2024-07-29 ENCOUNTER — Other Ambulatory Visit (HOSPITAL_COMMUNITY): Payer: Self-pay | Admitting: Adult Health

## 2024-07-29 DIAGNOSIS — Z1231 Encounter for screening mammogram for malignant neoplasm of breast: Secondary | ICD-10-CM

## 2024-09-05 ENCOUNTER — Ambulatory Visit (HOSPITAL_COMMUNITY)
Admission: RE | Admit: 2024-09-05 | Discharge: 2024-09-05 | Disposition: A | Source: Ambulatory Visit | Attending: Adult Health | Admitting: Adult Health

## 2024-09-05 DIAGNOSIS — Z1231 Encounter for screening mammogram for malignant neoplasm of breast: Secondary | ICD-10-CM

## 2024-09-14 ENCOUNTER — Ambulatory Visit: Payer: Self-pay | Admitting: Adult Health

## 2024-10-18 ENCOUNTER — Ambulatory Visit: Admitting: Adult Health

## 2025-01-10 ENCOUNTER — Ambulatory Visit: Payer: Self-pay | Admitting: Adult Health
